# Patient Record
Sex: Male | Born: 1975 | Race: Black or African American | Hispanic: No | Marital: Married | Smoking: Never smoker
Health system: Southern US, Community
[De-identification: ages and names within clinical notes are randomized; demographics above are authoritative.]

## PROBLEM LIST (undated history)

## (undated) DIAGNOSIS — M7989 Other specified soft tissue disorders: Secondary | ICD-10-CM

## (undated) DIAGNOSIS — E559 Vitamin D deficiency, unspecified: Secondary | ICD-10-CM

## (undated) DIAGNOSIS — G4730 Sleep apnea, unspecified: Secondary | ICD-10-CM

## (undated) DIAGNOSIS — M2550 Pain in unspecified joint: Secondary | ICD-10-CM

## (undated) HISTORY — DX: Pain in unspecified joint: M25.50

## (undated) HISTORY — DX: Vitamin D deficiency, unspecified: E55.9

## (undated) HISTORY — DX: Other specified soft tissue disorders: M79.89

## (undated) HISTORY — PX: KNEE SURGERY: SHX244

## (undated) HISTORY — DX: Sleep apnea, unspecified: G47.30

## (undated) HISTORY — PX: KNEE ARTHROSCOPY: SUR90

## (undated) HISTORY — PX: WISDOM TOOTH EXTRACTION: SHX21

---

## 2013-07-19 ENCOUNTER — Emergency Department (HOSPITAL_COMMUNITY)
Admission: EM | Admit: 2013-07-19 | Discharge: 2013-07-19 | Disposition: A | Discharge status: Home / Self Care | Payer: Managed Care, Other (non HMO) | Attending: Emergency Medicine | Admitting: Emergency Medicine

## 2013-07-19 ENCOUNTER — Encounter (HOSPITAL_COMMUNITY): Payer: Self-pay | Admitting: Emergency Medicine

## 2013-07-19 ENCOUNTER — Emergency Department (HOSPITAL_COMMUNITY): Payer: Managed Care, Other (non HMO)

## 2013-07-19 DIAGNOSIS — X58XXXA Exposure to other specified factors, initial encounter: Secondary | ICD-10-CM | POA: Insufficient documentation

## 2013-07-19 DIAGNOSIS — Y939 Activity, unspecified: Secondary | ICD-10-CM | POA: Insufficient documentation

## 2013-07-19 DIAGNOSIS — S90426A Blister (nonthermal), unspecified lesser toe(s), initial encounter: Secondary | ICD-10-CM | POA: Insufficient documentation

## 2013-07-19 DIAGNOSIS — L089 Local infection of the skin and subcutaneous tissue, unspecified: Secondary | ICD-10-CM | POA: Insufficient documentation

## 2013-07-19 DIAGNOSIS — Y929 Unspecified place or not applicable: Secondary | ICD-10-CM | POA: Insufficient documentation

## 2013-07-19 MED ORDER — HYDROCODONE-ACETAMINOPHEN 5-325 MG PO TABS: 1.0000 | ORAL_TABLET | Freq: Four times a day (QID) | ORAL | Status: DC | PRN

## 2013-07-19 MED ORDER — MUPIROCIN CALCIUM 2 % EX CREA: TOPICAL_CREAM | Freq: Three times a day (TID) | CUTANEOUS | Status: DC

## 2013-07-19 MED ORDER — CEPHALEXIN 500 MG PO CAPS: 500.0000 mg | ORAL_CAPSULE | Freq: Four times a day (QID) | ORAL | Status: DC

## 2013-07-19 NOTE — ED Notes (Signed)
Pt reports one week hx of pain and swelling in 5th toe of  r/foot. Pt c/o "popping" sensation in foot followed by persistent pain and tenderness. Tx with Motrin

## 2013-07-19 NOTE — ED Provider Notes (Signed)
CSN: [id number]     Arrival date & time 07/19/13  1256 History  This chart was scribed for non-physician practitioner working with Celene Kras, MD by Ashley Jacobs, ED scribe. This patient was seen in room WTR5/WTR5 and the patient's care was started at 1:36 PM.   First MD Initiated Contact with Patient 07/19/13 1305     Chief Complaint  Patient presents with  . Foot Pain    r/foot pain x 1 week   (Consider location/radiation/quality/duration/timing/severity/associated sxs/prior Treatment) Patient is a 37 y.o. male presenting with lower extremity pain. The history is provided by the patient and medical records. No language interpreter was used.  Foot Pain This is a new problem. The current episode started more than 2 days ago. The problem occurs constantly. The problem has not changed since onset.The symptoms are aggravated by walking and standing. Nothing relieves the symptoms.   HPI Comments: Oday Ridings is a 37 y.o. male who arrives to the Emergency Department complaining of foot pain that presented 6 days ago after feeling a "popping" sensation on his right dorsal foot. Pt is experiencing swelling and purulent drainage from his inner right fifth toe. He was seen at the [facility] and received a X-Ray which reports negative findings. Pt denies being diabetic. He denies any allergies to antibiotics but is allergic to Tessalon. He has tried Motrin with no relief.    History reviewed. No pertinent past medical history. Past Surgical History  Procedure Laterality Date  . Knee surgery      l/knee   Family History  Problem Relation Age of Onset  . Hypertension Mother   . Diabetes Father   . Hypertension Father    History  Substance Use Topics  . Smoking status: Never Smoker   . Smokeless tobacco: Not on file  . Alcohol Use: Yes    Review of Systems  Skin: Positive for wound.       Swelling and pustulant drainage from inner right fifth toe  All other systems  reviewed and are negative.    Allergies  Tessalon  Home Medications   Current Outpatient Rx  Name  Route  Sig  Dispense  Refill  . ibuprofen (ADVIL,MOTRIN) 200 MG tablet   Oral   Take 200 mg by mouth every 6 (six) hours as needed for pain.         . phentermine 37.5 MG capsule   Oral   Take 37.5 mg by mouth every morning.         . Vitamin D, Ergocalciferol, (DRISDOL) 50000 UNITS CAPS capsule   Oral   Take 50,000 Units by mouth every 7 (seven) days.          BP 127/70  Pulse 87  Temp(Src) 98 F (36.7 C) (Oral)  Resp 18  Wt 362 lb (164.202 kg)  SpO2 100% Physical Exam  Nursing note and vitals reviewed. Constitutional: He is oriented to person, place, and time. He appears well-developed and well-nourished. No distress.  HENT:  Head: Normocephalic and atraumatic.  Mouth/Throat: Oropharynx is clear and moist.  Eyes: Conjunctivae are normal. Pupils are equal, round, and reactive to light. No scleral icterus.  Neck: Neck supple.  Cardiovascular: Normal rate, regular rhythm, normal heart sounds and intact distal pulses.   No murmur heard. Pulmonary/Chest: Effort normal and breath sounds normal. No stridor. No respiratory distress. He has no wheezes. He has no rales.  Abdominal: Soft. He exhibits no distension. There is no tenderness.  Musculoskeletal: Normal  range of motion. He exhibits no edema.  Swelling to right dorsal foot  Ulceration with purulent drainage from inner right fifth toe. Tender to palpitation over 5 th toe, 4th / 3rd metatarsal. DP intact.  Neurological: He is alert and oriented to person, place, and time.  Skin: Skin is warm and dry. No rash noted.  Psychiatric: He has a normal mood and affect. His behavior is normal.    ED Course  Procedures (including critical care time) DIAGNOSTIC STUDIES: Oxygen Saturation is 100% on room air, normal by my interpretation.    COORDINATION OF CARE: 1:38 PM Discussed course of care with pt which includes a  warm foot soak 5 times a day and antibiotics. Pt understands and agrees.  Labs Review Labs Reviewed - No data to display Imaging Review Dg Foot Complete Right  07/19/2013   CLINICAL DATA:  Right foot pain.  EXAM: RIGHT FOOT COMPLETE - 3+ VIEW  COMPARISON:  None.  FINDINGS: There is no evidence of fracture or dislocation. No significant arthropathy, evidence of bony lesion or bony destruction. Soft tissues are unremarkable.  IMPRESSION: Normal right foot radiographs.   Electronically Signed   By: Irish Lack M.D.   On: 07/19/2013 13:43    MDM   1. Blister of toe with infection, right, initial encounter      Pt with infected lesion in the right fifth toe. He is not diabetic or immunocompromised. Mild surrounding cellulities. Will start on keflex, topical bactroban, wound care and soaks. Follow up with pcp.   Filed Vitals:   07/19/13 1304  BP: 127/70  Pulse: 87  Temp: 98 F (36.7 C)  TempSrc: Oral  Resp: 18  Weight: 362 lb (164.202 kg)  SpO2: 100%     I personally performed the services described in this documentation, which was scribed in my presence. The recorded information has been reviewed and is accurate.    Lottie Mussel, PA-C 07/19/13 1831

## 2013-07-20 NOTE — ED Provider Notes (Signed)
Medical screening examination/treatment/procedure(s) were performed by non-physician practitioner and as supervising physician I was immediately available for consultation/collaboration.    Anniebell Bedore R Yuliya Nova, MD 07/20/13 0707 

## 2017-03-20 ENCOUNTER — Other Ambulatory Visit: Payer: Self-pay | Admitting: Sports Medicine

## 2017-03-20 DIAGNOSIS — M25561 Pain in right knee: Secondary | ICD-10-CM

## 2017-03-30 ENCOUNTER — Ambulatory Visit
Admission: RE | Admit: 2017-03-30 | Discharge: 2017-03-30 | Disposition: A | Discharge status: Home / Self Care | Payer: BLUE CROSS/BLUE SHIELD | Source: Ambulatory Visit | Attending: Sports Medicine | Admitting: Sports Medicine

## 2017-03-30 DIAGNOSIS — M25561 Pain in right knee: Secondary | ICD-10-CM

## 2018-06-03 ENCOUNTER — Encounter: Payer: Self-pay | Admitting: Family Medicine

## 2018-06-03 ENCOUNTER — Other Ambulatory Visit: Payer: Self-pay

## 2018-06-03 ENCOUNTER — Ambulatory Visit: Payer: BLUE CROSS/BLUE SHIELD | Admitting: Family Medicine

## 2018-06-03 VITALS — BP 128/68 | HR 100 | Temp 98.7°F | Resp 17 | Ht 69.0 in | Wt 300.8 lb

## 2018-06-03 DIAGNOSIS — Z113 Encounter for screening for infections with a predominantly sexual mode of transmission: Secondary | ICD-10-CM

## 2018-06-03 DIAGNOSIS — Z833 Family history of diabetes mellitus: Secondary | ICD-10-CM | POA: Diagnosis not present

## 2018-06-03 DIAGNOSIS — Z13220 Encounter for screening for lipoid disorders: Secondary | ICD-10-CM

## 2018-06-03 DIAGNOSIS — Z0000 Encounter for general adult medical examination without abnormal findings: Secondary | ICD-10-CM

## 2018-06-03 DIAGNOSIS — Z125 Encounter for screening for malignant neoplasm of prostate: Secondary | ICD-10-CM | POA: Diagnosis not present

## 2018-06-03 DIAGNOSIS — Z131 Encounter for screening for diabetes mellitus: Secondary | ICD-10-CM

## 2018-06-03 NOTE — Patient Instructions (Addendum)
   If you have lab work done today you will be contacted with your lab results within the next 2 weeks.  If you have not heard from us then please contact us. The fastest way to get your results is to register for My Chart.   IF you received an x-ray today, you will receive an invoice from [facility]. Please contact [facility] at [phone] with questions or concerns regarding your invoice.   IF you received labwork today, you will receive an invoice from [facility]. Please contact [facility] at [phone] with questions or concerns regarding your invoice.   Our billing staff will not be able to assist you with questions regarding bills from these companies.  You will be contacted with the lab results as soon as they are available. The fastest way to get your results is to activate your My Chart account. Instructions are located on the last page of this paperwork. If you have not heard from us regarding the results in 2 weeks, please contact this office.    Prediabetes Eating Plan Prediabetes-also called impaired glucose tolerance or impaired fasting glucose-is a condition that causes blood sugar (blood glucose) levels to be higher than normal. Following a healthy diet can help to keep prediabetes under control. It can also help to lower the risk of type 2 diabetes and heart disease, which are increased in people who have prediabetes. Along with regular exercise, a healthy diet:  Promotes weight loss.  Helps to control blood sugar levels.  Helps to improve the way that the body uses insulin.  What do I need to know about this eating plan?  Use the glycemic index (GI) to plan your meals. The index tells you how quickly a food will raise your blood sugar. Choose low-GI foods. These foods take a longer time to raise blood sugar.  Pay close attention to the amount of carbohydrates in the food that you eat. Carbohydrates increase blood sugar levels.  Keep track of how  many calories you take in. Eating the right amount of calories will help you to achieve a healthy weight. Losing about 7 percent of your starting weight can help to prevent type 2 diabetes.  You may want to follow a Mediterranean diet. This diet includes a lot of vegetables, lean meats or fish, whole grains, fruits, and healthy oils and fats. What foods can I eat? Grains Whole grains, such as whole-wheat or whole-grain breads, crackers, cereals, and pasta. Unsweetened oatmeal. Bulgur. Barley. Quinoa. Brown rice. Corn or whole-wheat flour tortillas or taco shells. Vegetables Lettuce. Spinach. Peas. Beets. Cauliflower. Cabbage. Broccoli. Carrots. Tomatoes. Squash. Eggplant. Herbs. Peppers. Onions. Cucumbers. Brussels sprouts. Fruits Berries. Bananas. Apples. Oranges. Grapes. Papaya. Mango. Pomegranate. Kiwi. Grapefruit. Cherries. Meats and Other Protein Sources Seafood. Lean meats, such as chicken and turkey or lean cuts of pork and beef. Tofu. Eggs. Nuts. Beans. Dairy Low-fat or fat-free dairy products, such as yogurt, cottage cheese, and cheese. Beverages Water. Tea. Coffee. Sugar-free or diet soda. Seltzer water. Milk. Milk alternatives, such as soy or almond milk. Condiments Mustard. Relish. Low-fat, low-sugar ketchup. Low-fat, low-sugar barbecue sauce. Low-fat or fat-free mayonnaise. Sweets and Desserts Sugar-free or low-fat pudding. Sugar-free or low-fat ice cream and other frozen treats. Fats and Oils Avocado. Walnuts. Olive oil. The items listed above may not be a complete list of recommended foods or beverages. Contact your dietitian for more options. What foods are not recommended? Grains Refined white flour and flour products, such as bread, pasta, snack foods,   and cereals. Beverages Sweetened drinks, such as sweet iced tea and soda. Sweets and Desserts Baked goods, such as cake, cupcakes, pastries, cookies, and cheesecake. The items listed above may not be a complete list of  foods and beverages to avoid. Contact your dietitian for more information. This information is not intended to replace advice given to you by your health care provider. Make sure you discuss any questions you have with your health care provider. Document Released: 02/16/2015 Document Revised: 03/09/2016 Document Reviewed: 10/28/2014 Elsevier Interactive Patient Education  2017 Elsevier Inc.  

## 2018-06-03 NOTE — Progress Notes (Signed)
Chief Complaint  Patient presents with  . New Patient (Initial Visit)    establish care.  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 No pertinent past medical history.  Current Outpatient Medications  Medication Sig Dispense Refill  . ibuprofen (ADVIL,MOTRIN) 200 MG tablet Take 200 mg by mouth every 6 (six) hours as needed for pain.    . Vitamin D, Ergocalciferol, (DRISDOL) 50000 UNITS CAPS capsule Take 50,000 Units by mouth every 7 (seven) days.     No current facility-administered medications for this visit.     Allergies:  Allergies  Allergen Reactions  . Tessalon [Benzonatate] Itching    Past Surgical History:  Procedure Laterality Date  . KNEE SURGERY     l/knee    Social History   Socioeconomic History  . Marital status: Married    Spouse name: Not on file  . Number of children: Not on file  . Years of education: Not on file  . Highest education level: Not on file  Occupational History  . Not on file  Social Needs  . Financial resource strain: Not very hard  . Food insecurity:    Worry: Never true    Inability: Never true  . Transportation needs:    Medical: No    Non-medical: No  Tobacco Use  . Smoking status: Never Smoker  . Smokeless tobacco: Never Used  Substance and Sexual Activity  . Alcohol use: Yes    Alcohol/week: 2.0 standard drinks    Types: 1 Cans of beer, 1 Shots of liquor per week    Comment: ocassionally  . Drug use: Never  . Sexual activity: Not on file  Lifestyle  . Physical activity:    Days per week: 5 days    Minutes per session: 50 min  . Stress: Rather much  Relationships  . Social connections:    Talks on phone: Not on file    Gets together: Not on  file    Attends religious service: Not on file    Active member of club or organization: Not on file    Attends meetings of clubs or organizations: Not on file    Relationship status: Married  Other Topics Concern  . Not on file  Social History Narrative  . Not on file    Family History  Problem Relation Age of Onset  . Hypertension Mother   . Hyperlipidemia Mother   . Diabetes Father   . Hypertension Father   . Cancer Father [id number]       liver cancer  . Mental illness Sister      Review of Systems  Constitutional: Negative for chills, fever and weight loss.  HENT: Negative for ear pain, hearing loss and tinnitus.   Eyes: Negative for blurred vision and double vision.  Respiratory: Negative for cough, shortness of breath and wheezing.   Cardiovascular: Negative for chest pain and palpitations.  Gastrointestinal: Negative for abdominal pain, heartburn, nausea and vomiting.  Genitourinary: Negative for dysuria, frequency and urgency.  Musculoskeletal: Negative for myalgias and neck pain.  Skin: Negative for itching and rash.  Neurological: Negative for dizziness, tingling and headaches.  Psychiatric/Behavioral: Negative for depression. The patient is not  nervous/anxious and does not have insomnia.    .ros   Objective: Vitals:   06/03/18 1627  BP: 128/68  Pulse: 100  Resp: 17  Temp: 98.7 F (37.1 C)  SpO2: 100%  Weight: (!) 300 lb 12.8 oz (136.4 kg)  Height: 5\' 9"  (1.753 m)  Body mass index is 44.42 kg/m.   Physical Exam  Constitutional: He is oriented to person, place, and time. He appears well-developed and well-nourished.  HENT:  Head: Normocephalic and atraumatic.  Right Ear: External ear normal.  Left Ear: External ear normal.  Nose: Nose normal.  Mouth/Throat: Oropharynx is clear and moist.  Eyes: Conjunctivae and EOM are normal.  Neck: Normal range of motion. Neck supple.  Cardiovascular: Normal rate, regular rhythm, normal heart sounds and intact distal  pulses.  No murmur heard. Pulmonary/Chest: Effort normal and breath sounds normal. No stridor. No respiratory distress. He has no wheezes. He has no rales. He exhibits no tenderness.  Abdominal: Soft. Bowel sounds are normal. He exhibits no distension and no mass. There is no tenderness. There is no guarding.  Musculoskeletal: Normal range of motion. He exhibits no edema.  Neurological: He is alert and oriented to person, place, and time. No cranial nerve deficit. Coordination normal.  Skin: Skin is warm. Capillary refill takes less than 2 seconds.  Psychiatric: He has a normal mood and affect. His behavior is normal. Judgment and thought content normal.    Assessment and Plan Denyse AmassCorey was seen today for new patient (initial visit).  Diagnoses and all orders for this visit:  Encounter for health maintenance examination in adult- reviewed age appropriate screenings Advised dental exam and eye exam  Family history of diabetes mellitus in father  Screening for prostate cancer -     PSA  Screen for STD (sexually transmitted disease) -     GC/Chlamydia Probe Amp -     Hepatitis B surface antigen -     HIV antibody -     RPR  Screening, lipid -     Lipid panel -     Comprehensive metabolic panel  Screening for diabetes mellitus -     Hemoglobin A1c  Morbid obesity (HCC)- pt working on a good plan for weight loss and has proving successful by losing weight consistently -     Hemoglobin A1c     Shavana Calder A Connee Ikner

## 2018-06-04 LAB — HEMOGLOBIN A1C
ESTIMATED AVERAGE GLUCOSE: 91 mg/dL
Hgb A1c MFr Bld: 4.8 % (ref 4.8–5.6)

## 2018-06-04 LAB — COMPREHENSIVE METABOLIC PANEL
ALT: 16 IU/L (ref 0–44)
AST: 18 IU/L (ref 0–40)
Albumin/Globulin Ratio: 1.4 (ref 1.2–2.2)
Albumin: 4.1 g/dL (ref 3.5–5.5)
Alkaline Phosphatase: 44 IU/L (ref 39–117)
BILIRUBIN TOTAL: 0.7 mg/dL (ref 0.0–1.2)
BUN/Creatinine Ratio: 9 (ref 9–20)
BUN: 10 mg/dL (ref 6–24)
CO2: 25 mmol/L (ref 20–29)
Calcium: 9.3 mg/dL (ref 8.7–10.2)
Chloride: 102 mmol/L (ref 96–106)
Creatinine, Ser: 1.09 mg/dL (ref 0.76–1.27)
GFR calc Af Amer: 96 mL/min/{1.73_m2} (ref >59)
GFR calc non Af Amer: 83 mL/min/{1.73_m2} (ref >59)
GLUCOSE: 92 mg/dL (ref 65–99)
Globulin, Total: 2.9 g/dL (ref 1.5–4.5)
Potassium: 4.3 mmol/L (ref 3.5–5.2)
Sodium: 140 mmol/L (ref 134–144)
Total Protein: 7 g/dL (ref 6.0–8.5)

## 2018-06-04 LAB — LIPID PANEL
Chol/HDL Ratio: 3.5 ratio (ref 0.0–5.0)
Cholesterol, Total: 183 mg/dL (ref 100–199)
HDL: 53 mg/dL (ref >39)
LDL CALC: 119 mg/dL — AB (ref 0–99)
Triglycerides: 57 mg/dL (ref 0–149)
VLDL Cholesterol Cal: 11 mg/dL (ref 5–40)

## 2018-06-04 LAB — HEPATITIS B SURFACE ANTIGEN: HEP B S AG: NEGATIVE

## 2018-06-04 LAB — RPR: RPR Ser Ql: NONREACTIVE

## 2018-06-04 LAB — HIV ANTIBODY (ROUTINE TESTING W REFLEX): HIV SCREEN 4TH GENERATION: NONREACTIVE

## 2018-06-04 LAB — GC/CHLAMYDIA PROBE AMP
Chlamydia trachomatis, NAA: NEGATIVE
Neisseria gonorrhoeae by PCR: NEGATIVE

## 2018-06-04 LAB — PSA: Prostate Specific Ag, Serum: 0.8 ng/mL (ref 0.0–4.0)

## 2018-07-02 IMAGING — MR MR KNEE*R* W/O CM
5 series · 38 of 40 positions shown · non-contrast
Comparison: None.

CLINICAL DATA: Patient twisted knee after falling off curb 2 weeks
ago and felt something pop. Severe pain beneath the knee cap with
swelling.

EXAM:
MRI OF THE RIGHT KNEE WITHOUT CONTRAST
TECHNIQUE: Multiplanar, multisequence MR imaging of the knee was performed. No
intravenous contrast was administered.

[Series 3: PD fat-sat · axial · right · 4.0mm · 0.47mm/px · z∈[-77,+55]mm · 9 of 31 slices shown (1 of 3)]
[im 1/31]
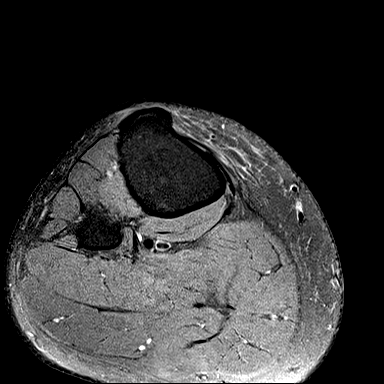
[im 4/31]
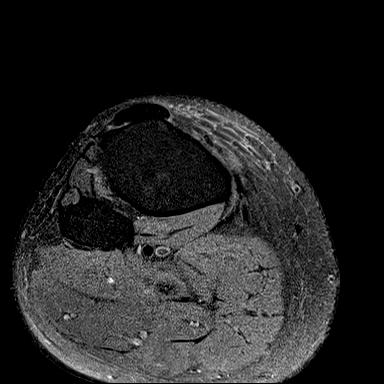
[im 8/31]
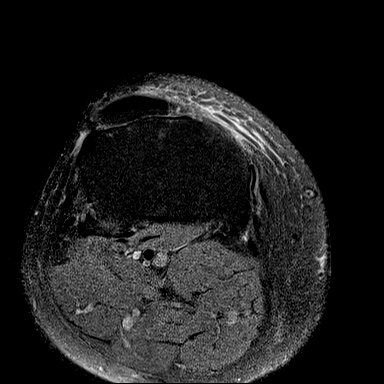
[im 12/31]
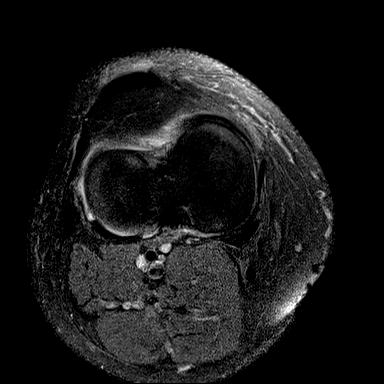
[im 16/31]
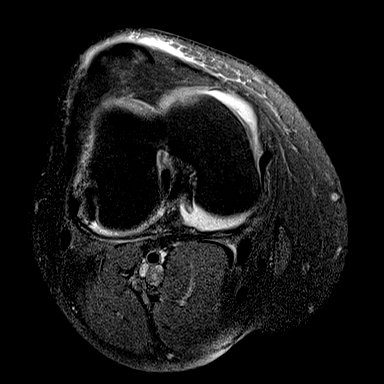
[im 19/31]
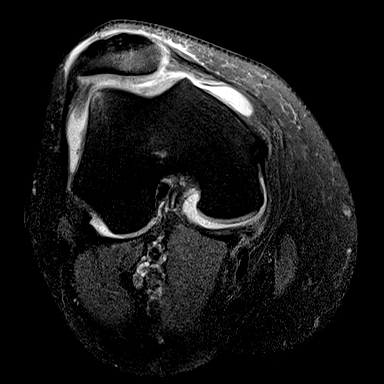
[im 23/31]
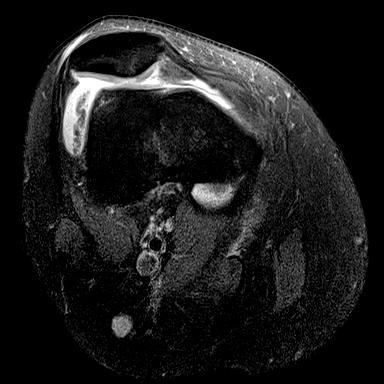
[im 27/31]
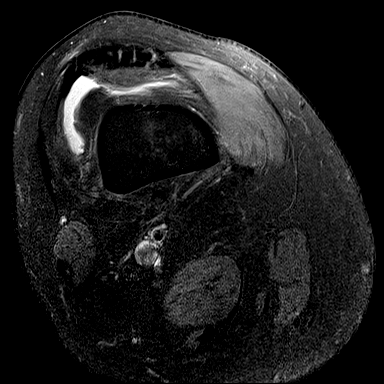
[im 31/31]
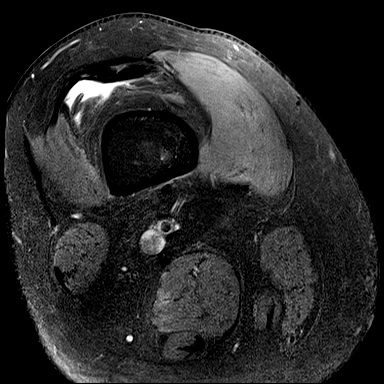

[Series 4: T1 · coronal · right · 3.0mm · 0.56mm/px · 6 of 33 slices shown]
[im 1/33]
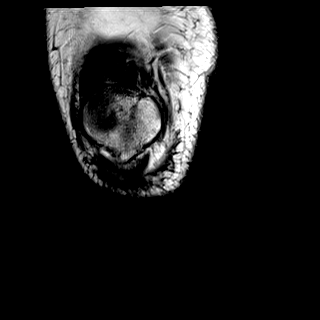
[im 5/33]
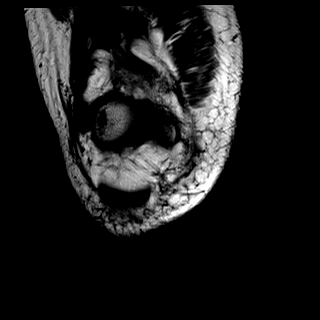
[im 10/33]
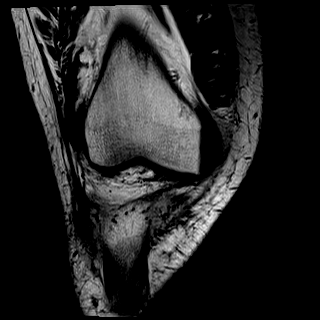
[im 14/33]
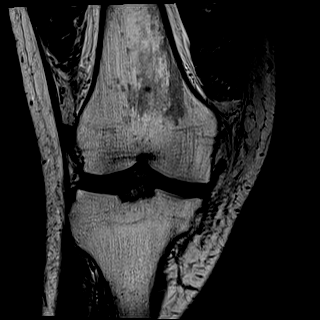
[im 19/33]
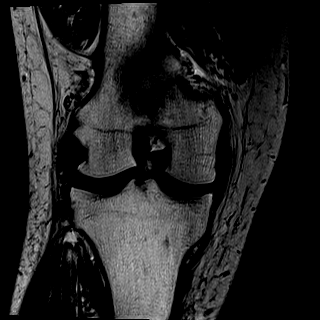
[im 23/33]
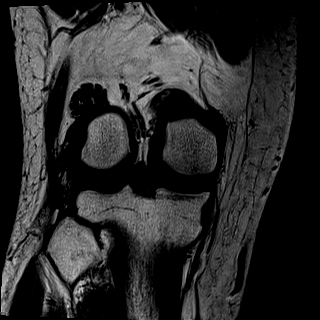

[Series 7: T2 fat-sat · coronal · right · 3.0mm · 0.47mm/px · 8 of 33 slices shown]
[im 1/33]
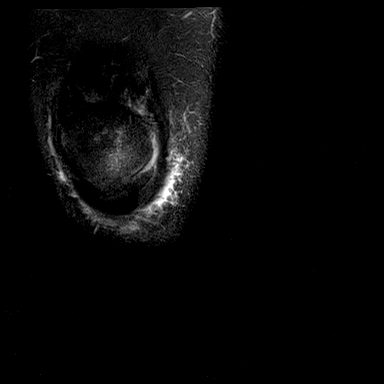
[im 5/33]
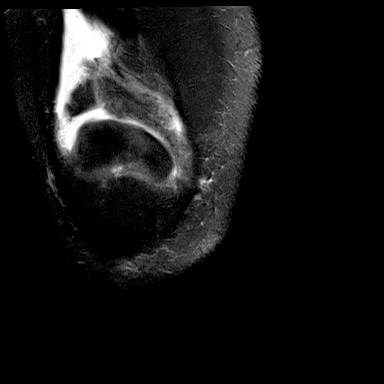
[im 10/33]
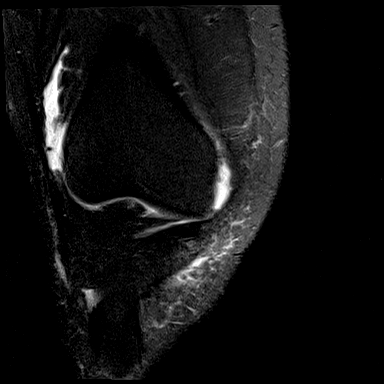
[im 14/33]
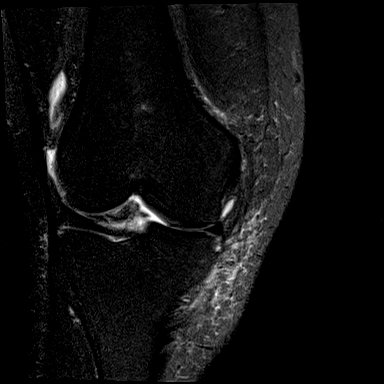
[im 19/33]
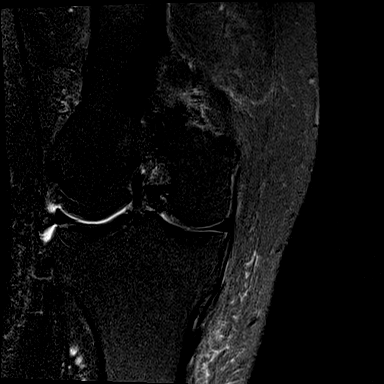
[im 23/33]
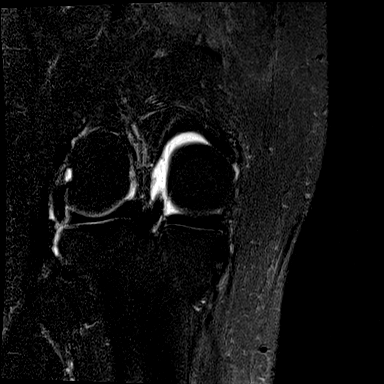
[im 28/33]
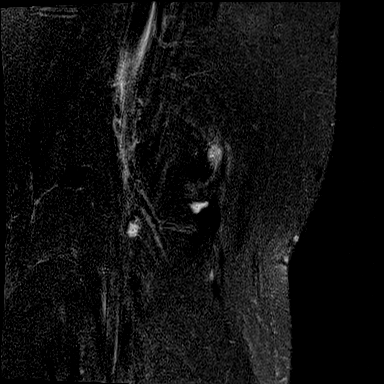
[im 33/33]
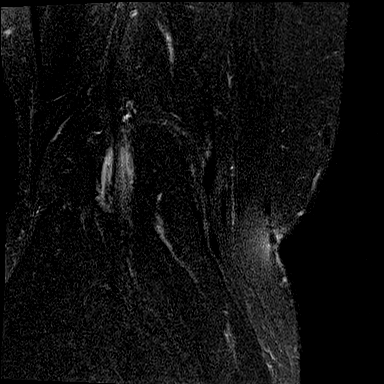

[Series 8: PD fat-sat · coronal · right · 3.0mm · 0.56mm/px · 8 of 33 slices shown (2 of 3)]
[im 1/33]
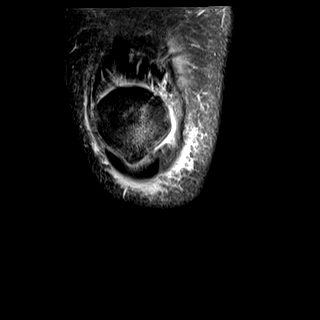
[im 5/33]
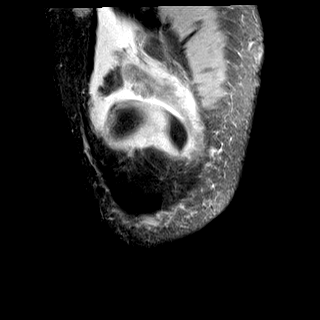
[im 10/33]
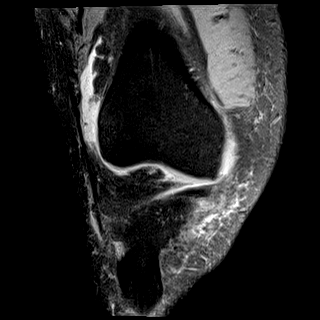
[im 14/33]
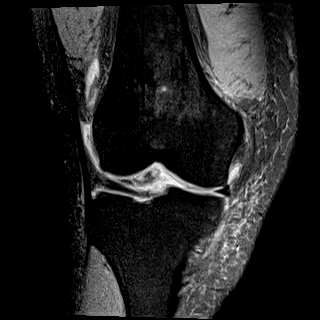
[im 19/33]
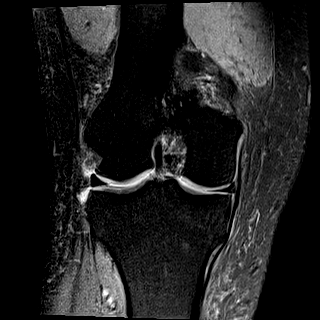
[im 23/33]
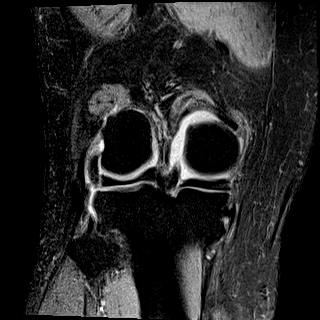
[im 28/33]
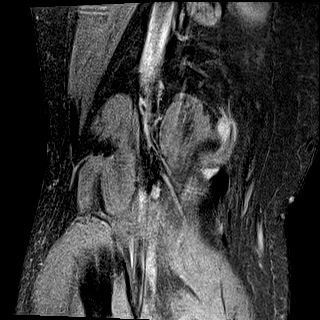
[im 33/33]
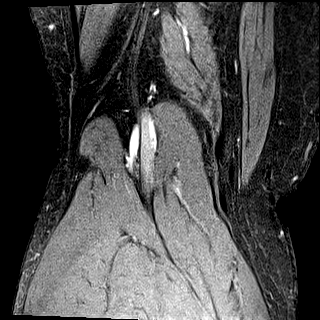

[Series 9: PD fat-sat · sagittal · right · 3.2mm · 0.62mm/px · 7 of 29 slices shown (3 of 3)]
[im 1/29]
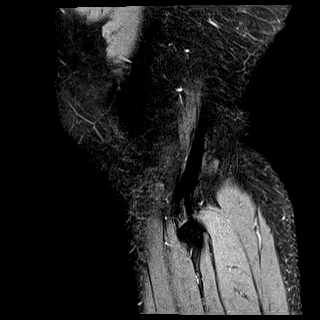
[im 5/29]
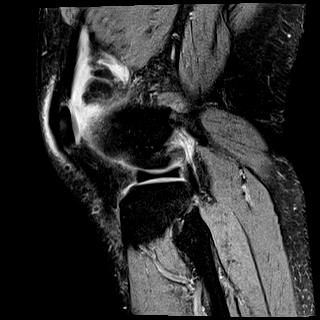
[im 10/29]
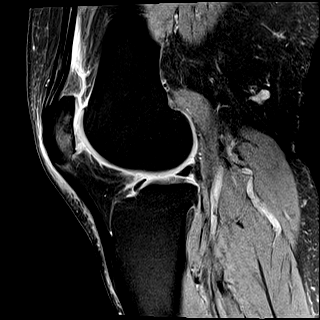
[im 15/29]
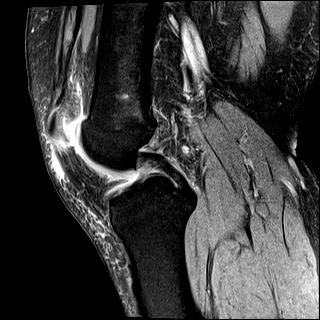
[im 19/29]
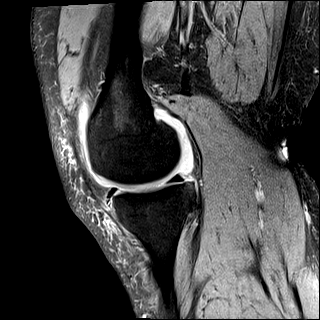
[im 24/29]
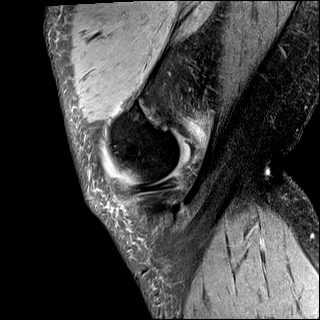
[im 29/29]
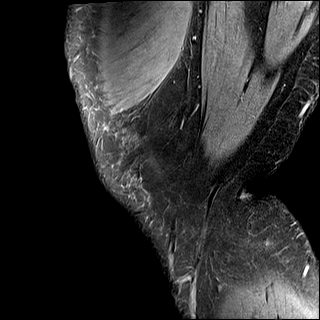

[38 of 40 positions shown; findings below may reference images not displayed]

FINDINGS: MENISCI

Medial meniscus:  Intact.

Lateral meniscus:  Intact

LIGAMENTS

Cruciates:  Intact ACL and PCL.

Collaterals:  Intact

CARTILAGE

Patellofemoral: Delaminating tear involving the patellar cartilage,
in some areas down to bone overlying the median ridge and medial
aspect of the lateral patellar cartilage. This area spans a
transverse length of 2.2 cm, series 3, image 11. This in conjunction
with reactive marrow edema of the patella and anterior lateral
femoral condyle would be keeping with stigmata of the lateral
patellar dislocation with subsequent reduction and resultant
osteochondral of the patella.

Medial:  Intact

Lateral:  Intact

Joint:    Small to moderate joint effusion

Popliteal Fossa:  Tiny popliteal cyst.  Intact popliteus.

Extensor Mechanism: Intact extensor mechanism tendon. Trace edema
adjacent to the inferior pole of the patella. The patellar
retinaculum and MPFL appear intact.

Bones: Transchondral fracture of the patella, series 9 image with
bone contusions of the patella and anterior lateral femoral condyle.

Other: Anterior subcutaneous edema overlying the patella and
patellar tendon.
IMPRESSION: 1. Bone marrow edema of the patella and lateral femoral condyle in a
pattern suspicious for lateral patellar dislocation with subsequent
reduction. There does appear to be a delaminating tear involving the
patellar cartilage overlying the median ridge and portions of the
lateral patellar facet cartilage in some areas down to bone. A
transchondral fracture of the patella suspected given hypointense
linear signal seen on the sagittal images deep to the chondral
defect.
2. Intact menisci.
3. Intact collateral and cruciate ligaments.

## 2019-10-17 HISTORY — PX: VASECTOMY: SHX75

## 2021-01-13 DIAGNOSIS — H6120 Impacted cerumen, unspecified ear: Secondary | ICD-10-CM | POA: Diagnosis not present

## 2021-01-13 DIAGNOSIS — M7551 Bursitis of right shoulder: Secondary | ICD-10-CM | POA: Diagnosis not present

## 2021-02-28 ENCOUNTER — Ambulatory Visit
Admission: EM | Admit: 2021-02-28 | Discharge: 2021-02-28 | Discharge status: Against Medical Advice | Payer: BLUE CROSS/BLUE SHIELD

## 2021-04-22 ENCOUNTER — Other Ambulatory Visit: Payer: Self-pay

## 2021-04-22 ENCOUNTER — Encounter: Payer: Self-pay | Admitting: Emergency Medicine

## 2021-04-22 ENCOUNTER — Ambulatory Visit
Admission: EM | Admit: 2021-04-22 | Discharge: 2021-04-22 | Disposition: A | Discharge status: Home / Self Care | Payer: BC Managed Care – PPO | Attending: Medical Oncology | Admitting: Medical Oncology

## 2021-04-22 DIAGNOSIS — M25511 Pain in right shoulder: Secondary | ICD-10-CM | POA: Diagnosis not present

## 2021-04-22 MED ORDER — METHOCARBAMOL 500 MG PO TABS: 500.0000 mg | ORAL_TABLET | Freq: Two times a day (BID) | ORAL | 0 refills | Status: DC

## 2021-04-22 MED ORDER — MELOXICAM 7.5 MG PO TABS: 7.5000 mg | ORAL_TABLET | Freq: Every day | ORAL | 0 refills | Status: DC

## 2021-04-22 NOTE — ED Provider Notes (Addendum)
[facility]    CSN: [id number] Arrival date & time: 04/22/21  1634      History   Chief Complaint Chief Complaint  Patient presents with   Shoulder Pain    right    HPI Bill Smith is a 45 y.o. male.   HPI  Shoulder Pain: Patient reports that he has had right shoulder pain for about 2 months.  Pain started after a mild injury.  Pain is about the same to potentially slightly worsened since this time.  He states that about 2 weeks ago he was seen by a other urgent care office for his discomfort.  He reports that he had normal x-ray imaging that was told that he had some inflammation in his shoulder.  He was given medication that he does not recall but he did not take this but for a few doses without improvement so he stopped this medication.  He wonders what the next steps are that he needs to do today.  He denies any significant numbness or tingling, loss of sensation or swelling of the arm.  History reviewed. No pertinent past medical history.  There are no problems to display for this patient.   Past Surgical History:  Procedure Laterality Date   KNEE SURGERY     l/knee       Home Medications    Prior to Admission medications   Medication Sig Start Date End Date Taking? Authorizing Provider  ibuprofen (ADVIL,MOTRIN) 200 MG tablet Take 200 mg by mouth every 6 (six) hours as needed for pain.    [provider]  Vitamin D, Ergocalciferol, (DRISDOL) 50000 UNITS CAPS capsule Take 50,000 Units by mouth every 7 (seven) days.    [provider]    Family History Family History  Problem Relation Age of Onset   Hypertension Mother    Hyperlipidemia Mother    Diabetes Father    Hypertension Father    Cancer Father 37       liver cancer   Mental illness Sister     Social History Social History   Tobacco Use   Smoking status: Never   Smokeless tobacco: Never  Vaping Use   Vaping Use: Never used  Substance Use Topics   Alcohol use:  Yes    Alcohol/week: 2.0 standard drinks    Types: 1 Cans of beer, 1 Shots of liquor per week    Comment: ocassionally   Drug use: Never     Allergies   Tessalon [benzonatate]   Review of Systems Review of Systems  As stated above in HPI Physical Exam Triage Vital Signs ED Triage Vitals  Enc Vitals Group     BP 04/22/21 1716 (!) 119/95     Pulse Rate 04/22/21 1716 82     Resp 04/22/21 1716 16     Temp 04/22/21 1716 98.4 F (36.9 C)     Temp Source 04/22/21 1716 Oral     SpO2 04/22/21 1716 97 %     Weight 04/22/21 1713 (!) 330 lb (149.7 kg)     Height 04/22/21 1713 5\' 9"  (1.753 m)     Head Circumference --      Peak Flow --      Pain Score 04/22/21 1713 7     Pain Loc --      Pain Edu? --      Excl. in GC? --    No data found.  Updated Vital Signs BP (!) 119/95 (BP Location: Left Arm)  Pulse 82   Temp 98.4 F (36.9 C) (Oral)   Resp 16   Ht 5\' 9"  (1.753 m)   Wt (!) 330 lb (149.7 kg)   SpO2 97%   BMI 48.73 kg/m    Physical Exam Vitals and nursing note reviewed.  Constitutional:      Appearance: Normal appearance.  Musculoskeletal:        General: Tenderness (right anterior shoulder) present. No swelling. Normal range of motion.     Right lower leg: No edema.     Left lower leg: No edema.     Comments: No mall empty can testing, normal Apley scratch testing, normal arc of motion  Skin:    General: Skin is warm.     Coloration: Skin is not jaundiced or pale.     Findings: No bruising, erythema or rash.  Neurological:     Mental Status: He is alert.     Comments: Strength 5 out of 5 bilaterally with intact sensation of bilateral arms     UC Treatments / Results  Labs (all labs ordered are listed, but only abnormal results are displayed) Labs Reviewed - No data to display  EKG   Radiology No results found.  Procedures Procedures (including critical care time)  Medications Ordered in UC Medications - No data to display  Initial  Impression / Assessment and Plan / UC Course  I have reviewed the triage vital signs and the nursing notes.  Pertinent labs & imaging results that were available during my care of the patient were reviewed by me and considered in my medical decision making (see chart for details).     New.  We are going to trial him on muscle relaxers and NSAIDs.  I discussed with patient the benefits of trialing these medications ahead of seeing orthopedics.  We discussed how to use these medications along with common potential side effects and precautions.  Final Clinical Impressions(s) / UC Diagnoses   Final diagnoses:  None   Discharge Instructions   None    ED Prescriptions   None    PDMP not reviewed this encounter.   , PA-C 04/22/21 1729    06/23/21, PA-C 04/22/21 1731

## 2021-04-22 NOTE — ED Triage Notes (Signed)
Patient states that he felt a pop in his right shoulder about a month ago.  Patient states that he had an xray done couple of weeks ago.  Patient reports ongoing pain in his right shoulder.

## 2021-05-26 DIAGNOSIS — Z20822 Contact with and (suspected) exposure to covid-19: Secondary | ICD-10-CM | POA: Diagnosis not present

## 2021-10-03 ENCOUNTER — Other Ambulatory Visit: Payer: Self-pay

## 2021-10-03 ENCOUNTER — Encounter: Payer: Self-pay | Admitting: Family Medicine

## 2021-10-03 ENCOUNTER — Ambulatory Visit: Payer: BC Managed Care – PPO | Admitting: Family Medicine

## 2021-10-03 VITALS — BP 122/78 | HR 95 | Ht 69.0 in | Wt 358.0 lb

## 2021-10-03 DIAGNOSIS — M7541 Impingement syndrome of right shoulder: Secondary | ICD-10-CM

## 2021-10-03 DIAGNOSIS — M7521 Bicipital tendinitis, right shoulder: Secondary | ICD-10-CM

## 2021-10-03 HISTORY — DX: Impingement syndrome of right shoulder: M75.41

## 2021-10-03 MED ORDER — DICLOFENAC SODIUM 75 MG PO TBEC: 75.0000 mg | DELAYED_RELEASE_TABLET | Freq: Two times a day (BID) | ORAL | 0 refills | Status: DC

## 2021-10-03 NOTE — Assessment & Plan Note (Signed)
See additional assessment(s) for plan details. 

## 2021-10-03 NOTE — Assessment & Plan Note (Signed)
Right-hand-dominant patient with several months (02/2021 timeframe) right anterolateral shoulder pain, atraumatic in nature, did note close proximity to pain starting after a move.  Pain without clear radiation, aggravated by overhead and reaching behind activities, strenuous activities, has noted intermittent night pain, mixed response from various Rx and OTC NSAIDs and treatments to date.  He does state that he had outside x-rays imaging which revealed no acute issues.  He denies any overt neck or elbow pain.  Physical examination reveals full forward flexion with pain to the anterior shoulder during maximal forward flexion, abduction limited by 5 degrees when compared to contralateral, this is secondary to anterolateral pain, external rotation and extension/internal rotation are benign.  Rotator cuff strength testing reveals full painless external and internal rotation against resistance, isolated supraspinatus testing with 5/5 strength, recreates pain, nontender throughout, positive Neer's, positive Hawkins, positive speeds, positive Yergason's; the latter 2 provocative testing are maximally painful.  Patient is constellation of findings and history are most consistent with biceps tendinitis with secondary compensatory rotator cuff related subacromial impingement.  Given the timeline and clinical course I reviewed various treatment strategies with the patient and he is amenable to scheduled diclofenac 75 mg for 3 weeks, regular icing regimen, activity modification, and close follow-up in 3 weeks time.  Pending symptoms at return, if suboptimal, can consider ultrasound-guided biceps tendon sheath and/or subacromial injections.  Once symptoms adequately improved, plan for home-based versus formal physical therapy.

## 2021-10-03 NOTE — Progress Notes (Signed)
°  ° °  Primary Care / Sports Medicine Office Visit  Patient Information:  Patient ID: Bill Smith, male DOB: 13-Nov-1975 Age: 45 y.o. MRN: [id number]   Bill Smith is a pleasant 45 y.o. male presenting with the following:  Chief Complaint  Patient presents with   New Patient (Initial Visit)   Establish Care   Shoulder Pain    Right; started 04/2021; no known injury or trauma, was sleeping on his right side and felt a pop and ever since has had pain; limited ROM and describes as torn, radiates to fingers, throbbing, sharp, constant; has used ice, heat, pain cream, Tylenol arthritis, ibuprofen, meloxicam, methocarbamol with no relief; right-handedness; 8/10 pain     Patient Active Problem List   Diagnosis Date Noted   Biceps tendinitis, right 10/03/2021   Rotator cuff impingement syndrome, right 10/03/2021    Vitals:   10/03/21 0836  BP: 122/78  Pulse: 95  SpO2: 98%   Vitals:   10/03/21 0836  Weight: (!) 358 lb (162.4 kg)  Height: 5\' 9"  (1.753 m)   Body mass index is 52.87 kg/m.  No results found.   Independent interpretation of notes and tests performed by another provider:   None  Procedures performed:   None  Pertinent History, Exam, Impression, and Recommendations:   Biceps tendinitis, right Right-hand-dominant patient with several months (02/2021 timeframe) right anterolateral shoulder pain, atraumatic in nature, did note close proximity to pain starting after a move.  Pain without clear radiation, aggravated by overhead and reaching behind activities, strenuous activities, has noted intermittent night pain, mixed response from various Rx and OTC NSAIDs and treatments to date.  He does state that he had outside x-rays imaging which revealed no acute issues.  He denies any overt neck or elbow pain.  Physical examination reveals full forward flexion with pain to the anterior shoulder during maximal forward flexion, abduction limited by 5 degrees when compared to  contralateral, this is secondary to anterolateral pain, external rotation and extension/internal rotation are benign.  Rotator cuff strength testing reveals full painless external and internal rotation against resistance, isolated supraspinatus testing with 5/5 strength, recreates pain, nontender throughout, positive Neer's, positive Hawkins, positive speeds, positive Yergason's; the latter 2 provocative testing are maximally painful.  Patient is constellation of findings and history are most consistent with biceps tendinitis with secondary compensatory rotator cuff related subacromial impingement.  Given the timeline and clinical course I reviewed various treatment strategies with the patient and he is amenable to scheduled diclofenac 75 mg for 3 weeks, regular icing regimen, activity modification, and close follow-up in 3 weeks time.  Pending symptoms at return, if suboptimal, can consider ultrasound-guided biceps tendon sheath and/or subacromial injections.  Once symptoms adequately improved, plan for home-based versus formal physical therapy.  Rotator cuff impingement syndrome, right See additional assessment(s) for plan details.   Orders & Medications Meds ordered this encounter  Medications   diclofenac (VOLTAREN) 75 MG EC tablet    Sig: Take 1 tablet (75 mg total) by mouth 2 (two) times daily.    Dispense:  60 tablet    Refill:  0   No orders of the defined types were placed in this encounter.    Return in about 3 weeks (around 10/24/2021).     12/22/2021, MD   Primary Care Sports Medicine [facility]

## 2021-10-03 NOTE — Patient Instructions (Signed)
-   Dose diclofenac twice daily with food (no other NSAIDs while on this medication) - Perform activity as tolerated using shoulder symptoms as a guide - Recommend at least once daily 20 minutes icing of the shoulder, can repeat as often as needed - Return for follow-up in 3 weeks, contact for any questions between now and then

## 2021-10-24 ENCOUNTER — Other Ambulatory Visit: Payer: Self-pay

## 2021-10-24 ENCOUNTER — Encounter: Payer: Self-pay | Admitting: Family Medicine

## 2021-10-24 ENCOUNTER — Inpatient Hospital Stay (INDEPENDENT_AMBULATORY_CARE_PROVIDER_SITE_OTHER): Payer: BC Managed Care – PPO | Admitting: Radiology

## 2021-10-24 ENCOUNTER — Ambulatory Visit: Payer: BC Managed Care – PPO | Admitting: Family Medicine

## 2021-10-24 VITALS — BP 98/64 | HR 84 | Ht 69.0 in | Wt 360.0 lb

## 2021-10-24 DIAGNOSIS — M7541 Impingement syndrome of right shoulder: Secondary | ICD-10-CM

## 2021-10-24 DIAGNOSIS — M7521 Bicipital tendinitis, right shoulder: Secondary | ICD-10-CM | POA: Diagnosis not present

## 2021-10-24 MED ORDER — TRIAMCINOLONE ACETONIDE 40 MG/ML IJ SUSP
80.0000 mg | Freq: Once | INTRAMUSCULAR | Status: AC
  Administered 2021-10-24: 80 mg

## 2021-10-24 MED ORDER — DICLOFENAC SODIUM 75 MG PO TBEC: 75.0000 mg | DELAYED_RELEASE_TABLET | Freq: Two times a day (BID) | ORAL | 0 refills | Status: DC | PRN

## 2021-10-24 NOTE — Patient Instructions (Signed)
You have just been given a cortisone injection to reduce pain and inflammation. After the injection you may notice immediate relief of pain as a result of the Lidocaine. It is important to rest the area of the injection for 24 to 48 hours after the injection. There is a possibility of some temporary increased discomfort and swelling for up to 72 hours until the cortisone begins to work. If you do have pain, simply rest the joint and use ice. If you can tolerate over the counter medications, you can try Tylenol, Aleve, or Advil for added relief per package instructions. - Continue diclofenac, take on an as-needed basis up to twice a day with food for shoulder pain until symptoms respond to cortisone - Start physical therapy, referral coordinator will contact you for scheduling - Return for follow-up in 6 weeks, contact your office for any questions between now and then

## 2021-10-24 NOTE — Assessment & Plan Note (Signed)
Patient with history of chronic right shoulder pain with ongoing exacerbation. See additional assessment(s) for plan details.  Given his persistent histopathology did elect to proceed with corticosteroid injections of the subacromial space and biceps tendon sheath.  Medication management with diclofenac performed, formal physical therapy, and follow-up in 6 weeks.

## 2021-10-24 NOTE — Assessment & Plan Note (Addendum)
Patient presents for follow-up to chronic right shoulder pain (onset 02/2021 timeframe) with exacerbation.  This is in the setting of right shoulder biceps tendinitis and secondary/compensatory subacromial impingement.  At the last visit he was advised scheduled diclofenac twice daily, ice), and activity modification.  Since that time he states that he has noted mild improvement while on the diclofenac however but he misses a dose pain returns to initial level of severity.  This continues to interfere with daily tasks.  Physical exam shows positive speeds and Yergason's, comparable to interval visit.  He does have preserved rotator cuff strength though with pain, positive Neer's and Hawkins though less symptomatic compared to prior exam.  Given his clinical course I have reviewed additional steps and he did elect to proceed with ultrasound-guided corticosteroid injections to the biceps tendon sheath and subacromial space.  Postinjection care reviewed.  Additionally, given the lengthy duration of symptomatology, I have recommended formal physical therapy, he is amenable to this.  He is to return for follow-up in 6 weeks for reevaluation.  Pending symptoms at return, if suboptimal despite adherence to shared plan, anticipate advanced imaging.  Also discussed medication management in regards to transitioning from scheduled diclofenac to as needed until symptoms respond to cortisone injections.

## 2021-10-24 NOTE — Progress Notes (Signed)
Primary Care / Sports Medicine Office Visit  Patient Information:  Patient ID: Bill Smith, male DOB: Nov 18, 1975 Age: 46 y.o. MRN: [id number]   Bill Smith is a pleasant 46 y.o. male presenting with the following:  Chief Complaint  Patient presents with   Biceps tendinitis, right    Better; taking diclofenac twice daily and alternating heat and ice with some relief; 4/10 pain    Patient Active Problem List   Diagnosis Date Noted   Biceps tendinitis, right 10/03/2021   Rotator cuff impingement syndrome, right 10/03/2021    Vitals:   10/24/21 0753  BP: 98/64  Pulse: 84  SpO2: 98%   Vitals:   10/24/21 0753  Weight: (!) 360 lb (163.3 kg)  Height: 5\' 9"  (1.753 m)   Body mass index is 53.16 kg/m.  LIMITED JOINT SPACE STRUCTURES UP RIGHT  Result Date: 10/24/2021 Procedure:  Injection of right shoulder biceps tendon sheath under ultrasound guidance. Ultrasound guidance utilized for visualization of out of plane biceps tendon sheath, hyperechogenicity noted surrounding the tendon most likely consistent with tendon thickening/inflammation, hypoechoic peritendinous response noted from injectate Samsung HS60 device utilized with permanent recording / reporting. Consent obtained and verified. Skin prepped in a sterile fashion. Ethyl chloride spray for topical local analgesia. Completed without difficulty and tolerated well. Medication: triamcinolone acetonide 40 mg/mL suspension for injection 1 mL total and 2 mL lidocaine 1% without epinephrine utilized for needle placement anesthetic Advised to contact for fevers/chills, erythema, induration, drainage, or persistent bleeding. Procedure:  Injection of right shoulder subacromial space under ultrasound guidance. Ultrasound guidance utilized for in plane approach to right shoulder subacromial space, deltoid and supraspinatus muscles visualized, no significant effusion noted Samsung HS60 device utilized with permanent recording /  reporting. Consent obtained and verified. Skin prepped in a sterile fashion. Ethyl chloride spray for topical local analgesia. Completed without difficulty and tolerated well. Medication: triamcinolone acetonide 40 mg/mL suspension for injection 1 mL total and 2 mL lidocaine 1% without epinephrine utilized for needle placement anesthetic Advised to contact for fevers/chills, erythema, induration, drainage, or persistent bleeding.    Independent interpretation of notes and tests performed by another provider:   None  Procedures performed:   Procedure:  Injection of right shoulder biceps tendon sheath under ultrasound guidance. Ultrasound guidance utilized for visualization of out of plane biceps tendon sheath, hyperechogenicity noted surrounding the tendon most likely consistent with tendon thickening/inflammation, hypoechoic peritendinous response noted from injectate Samsung HS60 device utilized with permanent recording / reporting. Consent obtained and verified. Skin prepped in a sterile fashion. Ethyl chloride spray for topical local analgesia.  Completed without difficulty and tolerated well. Medication: triamcinolone acetonide 40 mg/mL suspension for injection 1 mL total and 2 mL lidocaine 1% without epinephrine utilized for needle placement anesthetic Advised to contact for fevers/chills, erythema, induration, drainage, or persistent bleeding.  Procedure:  Injection of right shoulder subacromial space under ultrasound guidance. Ultrasound guidance utilized for in plane approach to right shoulder subacromial space, deltoid and supraspinatus muscles visualized, no significant effusion noted Samsung HS60 device utilized with permanent recording / reporting. Consent obtained and verified. Skin prepped in a sterile fashion. Ethyl chloride spray for topical local analgesia.  Completed without difficulty and tolerated well. Medication: triamcinolone acetonide 40 mg/mL suspension for injection  1 mL total and 2 mL lidocaine 1% without epinephrine utilized for needle placement anesthetic Advised to contact for fevers/chills, erythema, induration, drainage, or persistent bleeding.   Pertinent History, Exam, Impression, and Recommendations:   Biceps  tendinitis, right Patient presents for follow-up to chronic right shoulder pain (onset 02/2021 timeframe) with exacerbation.  This is in the setting of right shoulder biceps tendinitis and secondary/compensatory subacromial impingement.  At the last visit he was advised scheduled diclofenac twice daily, ice), and activity modification.  Since that time he states that he has noted mild improvement while on the diclofenac however but he misses a dose pain returns to initial level of severity.  This continues to interfere with daily tasks.  Physical exam shows positive speeds and Yergason's, comparable to interval visit.  He does have preserved rotator cuff strength though with pain, positive Neer's and Hawkins though less symptomatic compared to prior exam.  Given his clinical course I have reviewed additional steps and he did elect to proceed with ultrasound-guided corticosteroid injections to the biceps tendon sheath and subacromial space.  Postinjection care reviewed.  Additionally, given the lengthy duration of symptomatology, I have recommended formal physical therapy, he is amenable to this.  He is to return for follow-up in 6 weeks for reevaluation.  Pending symptoms at return, if suboptimal despite adherence to shared plan, anticipate advanced imaging.  Also discussed medication management in regards to transitioning from scheduled diclofenac to as needed until symptoms respond to cortisone injections.  Rotator cuff impingement syndrome, right Patient with history of chronic right shoulder pain with ongoing exacerbation. See additional assessment(s) for plan details.  Given his persistent histopathology did elect to proceed with corticosteroid  injections of the subacromial space and biceps tendon sheath.  Medication management with diclofenac performed, formal physical therapy, and follow-up in 6 weeks.   Orders & Medications Meds ordered this encounter  Medications   diclofenac (VOLTAREN) 75 MG EC tablet    Sig: Take 1 tablet (75 mg total) by mouth 2 (two) times daily as needed (shoulder pain).    Dispense:  60 tablet    Refill:  0   triamcinolone acetonide (KENALOG-40) injection 80 mg   Orders Placed This Encounter  Procedures   [place] LIMITED JOINT SPACE STRUCTURES UP RIGHT   Ambulatory referral to Physical Therapy     Return in about 6 weeks (around 12/05/2021) for follow-up right shoulder.     Jerrol Banana, MD   Primary Care Sports Medicine [facility]

## 2021-10-26 ENCOUNTER — Encounter: Payer: Self-pay | Admitting: Physical Therapy

## 2021-10-26 ENCOUNTER — Ambulatory Visit: Payer: BC Managed Care – PPO | Attending: Family Medicine | Admitting: Physical Therapy

## 2021-10-26 ENCOUNTER — Other Ambulatory Visit: Payer: Self-pay

## 2021-10-26 DIAGNOSIS — M25611 Stiffness of right shoulder, not elsewhere classified: Secondary | ICD-10-CM | POA: Diagnosis not present

## 2021-10-26 DIAGNOSIS — M7521 Bicipital tendinitis, right shoulder: Secondary | ICD-10-CM | POA: Diagnosis not present

## 2021-10-26 DIAGNOSIS — G8929 Other chronic pain: Secondary | ICD-10-CM | POA: Insufficient documentation

## 2021-10-26 DIAGNOSIS — M7541 Impingement syndrome of right shoulder: Secondary | ICD-10-CM | POA: Diagnosis not present

## 2021-10-26 DIAGNOSIS — M25511 Pain in right shoulder: Secondary | ICD-10-CM | POA: Insufficient documentation

## 2021-10-26 NOTE — Therapy (Signed)
[facility] [address] [place], [place], [address] Phone: [phone]   Fax:  [phone]  Physical Therapy Evaluation  Patient Details  Name: Bill DominoCorey Smith MRN: [id number] Date of Birth: Aug 02, 1976 Referring Provider (PT): Joseph BerkshireJason Matthews, MD   Encounter Date: 10/26/2021   PT End of Session - 10/26/21 1122     Visit Number 1    Number of Visits 13    Date for PT Re-Evaluation 12/07/21    Authorization Type BCBS max combined 30 visits PT/OT per year    Authorization Time Period Cert 6/38/75-6/43/321/11/23-3/16/23    Progress Note Due on Visit 10    PT Start Time 0848    PT Stop Time 0935    PT Time Calculation (min) 47 min    Activity Tolerance Patient tolerated treatment well;No increased pain    Behavior During Therapy WFL for tasks assessed/performed             Past Medical History:  Diagnosis Date   Vitamin D deficiency     History reviewed. No pertinent surgical history.  There were no vitals filed for this visit.    Subjective Assessment - 10/26/21 1310     Subjective Patient is a 46 year old male with primary complaint of R shoulder pain.    Pertinent History Patient is a 46 year old male with primary complaint of R shoulder pain. Patient has referring diagnoses of R biceps tendonitis and R rotator cuff impingement syndrome. S/p cortisone injection 10/24/21 that he feels has helped significantly. Patient reports having pop in his R shoulder when he was sleeping on his side. He reports difficulty with lifting his R arm above his head. Patient reports that medication and injection have both helped. Patient reports intermittent numbness affecting his R upper limb from shoulder to fingertips, affecting all 5 fingers per report. Patient reports not experiencing as much numbness/paresthesias over the last week. Patient is R hand dominant and has to seldom lift during the week or help someone lift. 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 Patient reports not having night pain presently. Patient reports no unexplained weight loss. Pt reports tolerating lying on R side well recently - he hasn't done this much due to being cautious per report. Pt does have intermittent clicking when moving his arm. Patient goals: no pain in his shoulder, able to throw football with his son.    Patient Stated Goals no pain in his shoulder, able to throw football with his son.    Currently in Pain? Yes    Aggravating Factors  cross-body adduction, overhead movement/overhead work    Pain Relieving Factors injection, Rx medication                [facility] PT Assessment - 10/26/21 1126       Assessment   Medical Diagnosis Biceps tendonitis, right; rotator cuff impingement syndrome, right    Referring Provider (PT) Joseph BerkshireJason Matthews, MD    Onset Date/Surgical Date 04/15/21    Hand Dominance Right    Next MD Visit 6 weeks    Prior Therapy None for this condition      Balance Screen   Has the patient fallen in the past 6 months No      Prior Function   Level of Independence Independent      Cognition   Overall Cognitive Status Within Functional Limits for tasks  assessed               SUBJECTIVE Chief complaint: Patient is a 46 year old male with primary complaint of R shoulder pain.  Onset: Patient is a 46 year old male with primary complaint of R shoulder pain. Patient has referring diagnoses of R biceps tendonitis and R rotator cuff impingement syndrome. S/p cortisone injection 10/24/21 that he feels has helped significantly. Patient reports having pop in his R shoulder when he was sleeping on his side. He reports difficulty with lifting his R arm above his head. Patient reports that medication and injection have both helped. Patient reports intermittent numbness affecting his R upper limb from shoulder to  fingertips, affecting all 5 fingers per report. Patient reports not experiencing as much numbness/paresthesias over the last week. Patient is R hand dominant and has to seldom lift during the week or help someone lift. 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 Patient reports not having night pain presently. Patient reports no unexplained weight loss. Pt reports tolerating lying on R side well recently - he hasn't done this much due to being cautious per report. Pt does have intermittent clicking when moving his arm. Patient goals: no pain in his shoulder, able to throw football with his son.  Shoulder trauma: No  Pain quality:  Pain: 0/10 Present, 0/10 Best, 8/10 Worst: Aggravating factors: cross-body adduction, overhead movement/overhead work Easing factors: injection, Rx medication 24 hour pain behavior: none Radiating pain: Yes Numbness/Tingling: Yes, previously felt down length of R upper limb, affecting all 5 digits Prior history of shoulder injury or pain: No Prior history of therapy for shoulder: No Follow-up appointment with MD: Yes, in 6 weeks Dominant hand: Right    OBJECTIVE  MUSCULOSKELETAL: Tremor: Normal Bulk: Normal Tone: Normal  Cervical Screen AROM: WFL and painless with overpressure in all planes Spurlings A (ipsilateral lateral flexion/axial compression): R: Negative L: Negative Hoffman Sign (cervical cord compression): R: Negative L: Negative  Palpation Tenderness to palpation along R bicipital groove, R anterior and middle deltoid   Strength R/L 5/5 Shoulder flexion (anterior deltoid/pec major/coracobrachialis, axillary n. (C5-6) and musculocutaneous n. (C5-7)) 4*/5 Shoulder abduction (deltoid/supraspinatus, axillary/suprascapular n, C5) 5/5 Shoulder external rotation (infraspinatus/teres minor) 5/5 Shoulder internal rotation  (subcapularis/lats/pec major) 5/5 Shoulder horizontal abduction 5/5 Elbow flexion (biceps brachii, brachialis, brachioradialis, musculoskeletal n, C5-6) 5/5 Elbow extension (triceps, radial n, C7)   AROM R/L 149/159 Shoulder flexion 135*/168 Shoulder abduction 81*/83 Shoulder external rotation 65*/70 Shoulder internal rotation *Indicates pain, overpressure performed unless otherwise indicated  PROM R/L 170*/not tested  Shoulder flexion (mild pain end-range) WFL/not tested      Shoulder abduction 85*/not tested  Shoulder external rotation 70/not tested   Shoulder internal rotation *Indicates pain, overpressure performed unless otherwise indicated  Accessory Motions/Glides Glenohumeral: Posterior: R: abnormal, decreased L: not examined Inferior: R: abnormal, decreased L: not examined Anterior: R: normal L: not examined    NEUROLOGICAL:  Sensation Grossly intact to light touch bilateral UE as determined by testing dermatomes C2-T2 Proprioception and hot/cold testing deferred on this date    SPECIAL TESTS  Rotator Cuff  Drop Arm Test: Negative Painful Arc (Pain from 60 to 120 degrees scaption): Positive Infraspinatus Muscle Test: Negative If all 3 tests positive, the probability of a full-thickness rotator cuff tear is 91%  Subacromial Impingement Hawkins-Kennedy: Negative Neer (Block scapula, PROM flexion): Positive Painful Arc (Pain from 60 to 120 degrees  scaption): Positive Empty Can: Negative External Rotation Resistance: Negative Horizontal Adduction: Not examined Scapular Assist: Negative  Labral Tear Biceps Load II (120 elevation, full ER, 90 elbow flexion, full supination, resisted elbow flexion): Negative   Bicep Tendon Pathology Speed (shoulder flexion to 90, external rotation, full elbow extension, and forearm supination with resistance: Positive Yergason's (resisted shoulder ER and supination/biceps tendon pathology): Negative  Shoulder  Instability Anterior Apprehension-Relocation: Positive for pain provocation and relief with relocation     TREATMENT  Therapeutic Exercise - for HEP establishment, discussion on appropriate exercise/activity modification, PT education   Reviewed baseline home exercises and provided handout for [facility] program (see Access Code); tactile cueing and therapist demonstration utilized as needed for carryover of proper technique to HEP.    Patient education:  current condition, anatomy involved, prognosis, plan of care. Discussion on activity modification to prevent flare-up of condition, including limiting repetitive overhead work and heavy lifting temporarily.       ASSESSMENT Pt is a pleasant 46 year old male referred for R shoulder pain with referring diagnoses of R biceps tendonitis and R RTC impingement syndrome. Pt is s/p cortisone injection to subacromial space and biceps tendon sheath with notable improvement following injection. PT examination reveals deficits in shoulder flexion/ABD/ER/IR AROM, decreased GHJ accessory mobility, decreased R shoulder abduction strength, postural changes/FHRS, and local nociceptive anterior shoulder pain. Pt will benefit from PT services to address deficits in strength, mobility, and pain in order to return to full function at home and work with less shoulder pain.     PT Short Term Goals - 10/26/21 1130       PT SHORT TERM GOAL #1   Title Pt will be independent with HEP in order to improve strength and decrease pain in order to improve pain-free function at home and work.    Baseline 10/26/21: Baseline HEP initiated    Time 3    Period Weeks    Status New    Target Date 11/16/21      PT SHORT TERM GOAL #2   Title Patient will have shoulder AROM symmetrical to opposite UE without reproduction of pain as needed for functional reaching, overhead work, self-care, and recovery of function    Baseline 10/26/21: Shoulder AROM R/L Flexion 149/59,  abduction 135/168, ER 81/83, IR 65/70 (with pain in ABD, ER IR)    Time 4    Period Weeks    Status New    Target Date 11/23/21               PT Long Term Goals - 10/26/21 2105       PT LONG TERM GOAL #1   Title Pt will decrease worst pain as reported on NPRS by at least 3 points in order to demonstrate clinically significant reduction in pain.    Baseline 10/26/21: 8/10 pain at worst    Time 6    Period Weeks    Status New    Target Date 12/07/21      PT LONG TERM GOAL #2   Title Patient will demonstrate improved function as evidenced by a score of 73 on FOTO measure for full participation in activities at home and in the community.    Baseline 10/26/21: 57    Time 6    Period Weeks    Status New    Target Date 12/07/21      PT LONG TERM GOAL #3   Title Pt will have strength of deltoid, middle trapezius, and lower trapezius up  to 5/5  in order to demonstrate improvement in strength and function as needed for performing intermittent lifting required for work    Baseline 10/26/21: 4/5 deltoid strength.    Time 6    Period Weeks    Status New    Target Date 12/07/21      PT LONG TERM GOAL #4   Title Patient will perform overhead throw without reproduction of pain as needed for playing catch with his son    Baseline 10/26/21: Pt unable to attain ABD/ER position needed for throwing    Time 6    Period Weeks    Status New    Target Date 12/07/21                     Plan - 10/26/21 2056     Clinical Impression Statement Pt is a pleasant 46 year old male referred for R shoulder pain with referring diagnoses of R biceps tendonitis and R RTC impingement syndrome. Pt is s/p cortisone injection to subacromial space and biceps tendon sheath with notable improvement following injection. PT examination reveals deficits in shoulder flexion/ABD/ER/IR AROM, decreased GHJ accessory mobility, decreased R shoulder abduction strength, postural changes/FHRS, and local nociceptive  anterior shoulder pain. Pt will benefit from PT services to address deficits in strength, mobility, and pain in order to return to full function at home and work with less shoulder pain.    Personal Factors and Comorbidities Comorbidity 2;Time since onset of injury/illness/exacerbation    Comorbidities Vit D deficiency, obesity    Examination-Activity Limitations Lift;Reach Overhead;Carry    Examination-Participation Restrictions Community Activity;Occupation   patient seldom has to lift/carry at work   Stability/Clinical Decision Making Evolving/Moderate complexity    Clinical Decision Making Moderate    Rehab Potential Good    PT Frequency 2x / week    PT Duration 6 weeks    PT Treatment/Interventions Cryotherapy;Electrical Stimulation;Moist Heat;Therapeutic activities;Therapeutic exercise;Neuromuscular re-education;Patient/family education;Manual techniques;Dry needling    PT Next Visit Plan Glenohumeral and scapulothoracic mobility, postural re-education, progressive shoulder ROM, graded introduction of shoulder complex/scapular stabilization    PT Home Exercise Plan Access Code [email or web address]    Consulted and Agree with Plan of Care Patient             Patient will benefit from skilled therapeutic intervention in order to improve the following deficits and impairments:  Decreased range of motion, Decreased strength, Impaired UE functional use, Pain, Postural dysfunction  Visit Diagnosis: Chronic right shoulder pain  Stiffness of right shoulder, not elsewhere classified  Biceps tendinitis, right     Problem List Patient Active Problem List   Diagnosis Date Noted   Biceps tendinitis, right 10/03/2021   Rotator cuff impingement syndrome, right 10/03/2021   Consuela Mimes, PT, DPT #[id number]  Gertie Exon, PT 10/26/2021, 9:40 PM  [facility] [address] [place], [place], [address] Phone: [phone]   Fax:   [phone]  Name: Wilford Merryfield MRN: [id number] Date of Birth: May 01, 1976

## 2021-10-31 ENCOUNTER — Ambulatory Visit: Payer: BC Managed Care – PPO | Admitting: Physical Therapy

## 2021-11-02 ENCOUNTER — Ambulatory Visit: Payer: BC Managed Care – PPO | Admitting: Physical Therapy

## 2021-11-02 ENCOUNTER — Other Ambulatory Visit: Payer: Self-pay

## 2021-11-02 ENCOUNTER — Encounter: Payer: Self-pay | Admitting: Physical Therapy

## 2021-11-02 DIAGNOSIS — M7541 Impingement syndrome of right shoulder: Secondary | ICD-10-CM | POA: Diagnosis not present

## 2021-11-02 DIAGNOSIS — G8929 Other chronic pain: Secondary | ICD-10-CM

## 2021-11-02 DIAGNOSIS — M7521 Bicipital tendinitis, right shoulder: Secondary | ICD-10-CM | POA: Diagnosis not present

## 2021-11-02 DIAGNOSIS — M25611 Stiffness of right shoulder, not elsewhere classified: Secondary | ICD-10-CM | POA: Diagnosis not present

## 2021-11-02 DIAGNOSIS — M25511 Pain in right shoulder: Secondary | ICD-10-CM | POA: Diagnosis not present

## 2021-11-02 NOTE — Therapy (Signed)
[facility] [address] [place], [place], [address] Phone: [phone]   Fax:  [phone]  Physical Therapy Treatment  Patient Details  Name: Bill Smith MRN: [id number] Date of Birth: [birth year] Referring Provider (PT): Joseph Berkshire, MD   Encounter Date: 11/02/2021   PT End of Session - 11/02/21 0852     Visit Number 2    Number of Visits 13    Date for PT Re-Evaluation 12/07/21    Authorization Type [employer]BS max combined 30 visits PT/OT per year    Authorization Time Period Cert 9/37/16-9/67/89    Progress Note Due on Visit 10    PT Start Time 0848    PT Stop Time 0932    PT Time Calculation (min) 44 min    Activity Tolerance Patient tolerated treatment well;No increased pain    Behavior During Therapy WFL for tasks assessed/performed             Past Medical History:  Diagnosis Date   Vitamin D deficiency     History reviewed. No pertinent surgical history.  There were no vitals filed for this visit.   Subjective Assessment - 11/02/21 0849     Subjective Patient reports no significant pain at arrival to PT. Patient reports doing well with his initial home exercise program. Patient reports ongoing clicking in his shoulder, but he does not have remarkable pain at this time.    Pertinent History Patient is a 46 year old male with primary complaint of R shoulder pain. Patient has referring diagnoses of R biceps tendonitis and R rotator cuff impingement syndrome. S/p cortisone injection 10/24/21 that he feels has helped significantly. Patient reports having pop in his R shoulder when he was sleeping on his side. He reports difficulty with lifting his R arm above his head. Patient reports that medication and injection have both helped. Patient reports intermittent numbness affecting his R upper limb from shoulder to fingertips, affecting all 5 fingers per report. Patient reports not experiencing as much numbness/paresthesias  over the last week. Patient is R hand dominant and has to seldom lift during the week or help someone lift. 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 Patient reports not having night pain presently. Patient reports no unexplained weight loss. Pt reports tolerating lying on R side well recently - he hasn't done this much due to being cautious per report. Pt does have intermittent clicking when moving his arm. Patient goals: no pain in his shoulder, able to throw football with his son.    Patient Stated Goals no pain in his shoulder, able to throw football with his son.                Upper body ergometer, 2 minutes forward, 2 minutes backward - for tissue warm-up to improve muscle performance, improved soft tissue mobility/extensibility - unbilled time     TREATMENT   Manual Therapy - for symptom modulation, soft tissue sensitivity and mobility, joint mobility, ROM   R shoulder PROM R glenohumeral joint mobilization, posterior and inferior; STM R anterior/middle deltoid Scapular mobilization; scapular elevation and upward rotation  Rhythmic stabilization with arm at 100 deg forward elevation; x 2 minutes   Neuromuscular Re-education - for upper extremity kinetic chain stability, exercises to promote shoulder complex/scapulothoracic stabilization and improved proprioception   Alternating shoulder taps; x10 alternating, modified plantigrade on raised table Wall clock; with  Green Theraband; x5 around clock   Therapeutic Exercise - for improved soft tissue flexibility and extensibility as needed for ROM, improved strength as needed to improve performance of CKC activities/functional movements  Submaximal isometrics at wall; flexion, ABD; review ed for HEP Serratus slide with foam roll at wall; x10 Pec major stretch; 3x30sec  Pt edu: HEP update and  review   ASSESSMENT Patient arrives with excellent motivation to participate in physical therapy. He demonstrates minimal PROM deficit of R shoulder and grossly WFL active forward elevation and shoulder complex abduction. He has minimal pain today and has responded well to recent injection.  Pt has significant stiffness with scapular elevation and upward rotation and glenohumeral joint posterior and inferior glide. He has no notable increase in pain with Tx today. Pt has remaining deficits in flexion/ABD/ER/IR AROM, decreased GHJ accessory mobility, decreased R shoulder abduction strength, postural changes/FHRS, and local nociceptive anterior shoulder pain. Patient will benefit from continued skilled therapeutic intervention to address the above deficits as needed for improved function and QoL.     PT Short Term Goals - 10/26/21 1130       PT SHORT TERM GOAL #1   Title Pt will be independent with HEP in order to improve strength and decrease pain in order to improve pain-free function at home and work.    Baseline 10/26/21: Baseline HEP initiated    Time 3    Period Weeks    Status New    Target Date 11/16/21      PT SHORT TERM GOAL #2   Title Patient will have shoulder AROM symmetrical to opposite UE without reproduction of pain as needed for functional reaching, overhead work, self-care, and recovery of function    Baseline 10/26/21: Shoulder AROM R/L Flexion 149/59, abduction 135/168, ER 81/83, IR 65/70 (with pain in ABD, ER IR)    Time 4    Period Weeks    Status New    Target Date 11/23/21               PT Long Term Goals - 10/26/21 2105       PT LONG TERM GOAL #1   Title Pt will decrease worst pain as reported on NPRS by at least 3 points in order to demonstrate clinically significant reduction in pain.    Baseline 10/26/21: 8/10 pain at worst    Time 6    Period Weeks    Status New    Target Date 12/07/21      PT LONG TERM GOAL #2   Title Patient will demonstrate  improved function as evidenced by a score of 73 on FOTO measure for full participation in activities at home and in the community.    Baseline 10/26/21: 57    Time 6    Period Weeks    Status New    Target Date 12/07/21      PT LONG TERM GOAL #3   Title Pt will have strength of deltoid, middle trapezius, and lower trapezius up to 5/5  in order to demonstrate improvement in strength and function as needed for performing intermittent lifting required for work    Baseline 10/26/21: 4/5 deltoid strength.    Time 6    Period Weeks    Status New    Target Date 12/07/21      PT LONG TERM GOAL #4   Title Patient will perform overhead throw without reproduction of pain as needed for playing catch with his son  Baseline 10/26/21: Pt unable to attain ABD/ER position needed for throwing    Time 6    Period Weeks    Status New    Target Date 12/07/21                   Plan - 11/02/21 1410     Clinical Impression Statement Patient arrives with excellent motivation to participate in physical therapy. He demonstrates minimal PROM deficit of R shoulder and grossly WFL active forward elevation and shoulder complex abduction. He has minimal pain today and has responded well to recent injection.  Pt has significant stiffness with scapular elevation and upward rotation and glenohumeral joint posterior and inferior glide. He has no notable increase in pain with Tx today. Pt has remaining deficits in flexion/ABD/ER/IR AROM, decreased GHJ accessory mobility, decreased R shoulder abduction strength, postural changes/FHRS, and local nociceptive anterior shoulder pain. Patient will benefit from continued skilled therapeutic intervention to address the above deficits as needed for improved function and QoL.    Personal Factors and Comorbidities Comorbidity 2;Time since onset of injury/illness/exacerbation    Comorbidities Vit D deficiency, obesity    Examination-Activity Limitations Lift;Reach  Overhead;Carry    Examination-Participation Restrictions Community Activity;Occupation   patient seldom has to lift/carry at work   Stability/Clinical Decision Making Evolving/Moderate complexity    Rehab Potential Good    PT Frequency 2x / week    PT Duration 6 weeks    PT Treatment/Interventions Cryotherapy;Electrical Stimulation;Moist Heat;Therapeutic activities;Therapeutic exercise;Neuromuscular re-education;Patient/family education;Manual techniques;Dry needling    PT Next Visit Plan Glenohumeral and scapulothoracic mobility, postural re-education, progressive shoulder ROM, graded introduction of shoulder complex/scapular stabilization    PT Home Exercise Plan Access Code [email or web address]    Consulted and Agree with Plan of Care Patient             Patient will benefit from skilled therapeutic intervention in order to improve the following deficits and impairments:  Decreased range of motion, Decreased strength, Impaired UE functional use, Pain, Postural dysfunction  Visit Diagnosis: Chronic right shoulder pain  Stiffness of right shoulder, not elsewhere classified  Biceps tendinitis, right     Problem List Patient Active Problem List   Diagnosis Date Noted   Biceps tendinitis, right 10/03/2021   Rotator cuff impingement syndrome, right 10/03/2021   Consuela MimesJeremy Dulcy Sida, PT, DPT #[id number]  Gertie ExonJeremy T Kyzer Blowe, PT 11/02/2021, 2:11 PM  [facility] [address] [place], [place], [address] Phone: [phone]   Fax:  [phone]  Name: Bunnie DominoCorey Smith MRN: [id number] Date of Birth: [birth year]

## 2021-11-07 ENCOUNTER — Other Ambulatory Visit: Payer: Self-pay

## 2021-11-07 ENCOUNTER — Encounter: Payer: Self-pay | Admitting: Physical Therapy

## 2021-11-07 ENCOUNTER — Ambulatory Visit: Payer: BC Managed Care – PPO | Admitting: Physical Therapy

## 2021-11-07 DIAGNOSIS — M25511 Pain in right shoulder: Secondary | ICD-10-CM | POA: Diagnosis not present

## 2021-11-07 DIAGNOSIS — M7521 Bicipital tendinitis, right shoulder: Secondary | ICD-10-CM | POA: Diagnosis not present

## 2021-11-07 DIAGNOSIS — M25611 Stiffness of right shoulder, not elsewhere classified: Secondary | ICD-10-CM | POA: Diagnosis not present

## 2021-11-07 DIAGNOSIS — M7541 Impingement syndrome of right shoulder: Secondary | ICD-10-CM | POA: Diagnosis not present

## 2021-11-07 DIAGNOSIS — G8929 Other chronic pain: Secondary | ICD-10-CM | POA: Diagnosis not present

## 2021-11-07 NOTE — Therapy (Signed)
[place] [address] [place], [place], [address] Phone: [phone]   Fax:  [phone]  Physical Therapy Treatment  Patient Details  Name: Bill Smith MRN: [id number] Date of Birth: Aug 27, 1976 Referring Provider (PT): Joseph Berkshire, MD   Encounter Date: 11/07/2021   PT End of Session - 11/07/21 1256     Visit Number 3    Number of Visits 13    Date for PT Re-Evaluation 12/07/21    Authorization Type [employer] max combined 30 visits PT/OT per year    Authorization Time Period Cert 2/45/80-9/98/33    Progress Note Due on Visit 10    PT Start Time 1016    PT Stop Time 1100    PT Time Calculation (min) 44 min    Activity Tolerance Patient tolerated treatment well;No increased pain    Behavior During Therapy WFL for tasks assessed/performed             Past Medical History:  Diagnosis Date   Vitamin D deficiency     History reviewed. No pertinent surgical history.  There were no vitals filed for this visit.   Subjective Assessment - 11/07/21 1021     Subjective Patient reports no pain at arrival to PT. Patient reports no soreness after last visit. He reports no major issues over the weekend.    Pertinent History Patient is a 46 year old male with primary complaint of R shoulder pain. Patient has referring diagnoses of R biceps tendonitis and R rotator cuff impingement syndrome. S/p cortisone injection 10/24/21 that he feels has helped significantly. Patient reports having pop in his R shoulder when he was sleeping on his side. He reports difficulty with lifting his R arm above his head. Patient reports that medication and injection have both helped. Patient reports intermittent numbness affecting his R upper limb from shoulder to fingertips, affecting all 5 fingers per report. Patient reports not experiencing as much numbness/paresthesias over the last week. Patient is R hand dominant and has to seldom lift during the week or  help someone lift. 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 Patient reports not having night pain presently. Patient reports no unexplained weight loss. Pt reports tolerating lying on R side well recently - he hasn't done this much due to being cautious per report. Pt does have intermittent clicking when moving his arm. Patient goals: no pain in his shoulder, able to throw football with his son.    Patient Stated Goals no pain in his shoulder, able to throw football with his son.                Upper body ergometer, 2 minutes forward, 2 minutes backward - for tissue warm-up to improve muscle performance, improved soft tissue mobility/extensibility - unbilled time         TREATMENT     Manual Therapy - for symptom modulation, soft tissue sensitivity and mobility, joint mobility, ROM    R shoulder PROM R glenohumeral joint mobilization, posterior and inferior; STM R anterior/middle deltoid Scapular mobilization; scapular elevation and upward rotation   *not today* Rhythmic stabilization with arm at 100 deg forward elevation; x 2 minutes     Neuromuscular Re-education - for upper extremity kinetic chain stability, exercises to promote shoulder complex/scapulothoracic stabilization and improved proprioception   Wall clock; with Green Theraband; x4 around clock Nautilus double-arm mid-row; 60lbs; 2x10 Bodyblade;  flexion 90 deg; 1x20sec, 1x30sec  *next visit* Alternating shoulder taps; x10 alternating, modified plantigrade on raised table     Therapeutic Exercise - for improved soft tissue flexibility and extensibility as needed for shoulder complex ROM and scapular mobility, specific strengthening with graded increase in load     Serratus slide with foam roll at wall; 2x10, with Red Tband for external rotation moment Pec major stretch; 2x30sec  Pt edu:  reviewed existing home exercises and encouraged continued HEP with precaution for significant loading acutely following steroid injection    *next visit* Standing scaption with Dumbbells;     *not today* Submaximal isometrics at wall; flexion, ABD; review ed for HEP        ASSESSMENT Patient demonstrates improving scapular elevation with moderate upward rotation deficit remaining. He has normal R shoulder PROM without pain reproduction through full range. He is able to notably progress with stabilization work and postural re-edu in clinic without notable complaint of pain. He does report intermittent clicking, but this is not reproduced with unloaded flexion and abduction today. Pt has remaining deficits in flexion/ABD/ER/IR AROM, decreased GHJ accessory mobility, decreased R shoulder abduction strength, postural changes/FHRS, and local nociceptive anterior shoulder pain. Patient will benefit from continued skilled therapeutic intervention to address the above deficits as needed for improved function and QoL.     PT Short Term Goals - 10/26/21 1130       PT SHORT TERM GOAL #1   Title Pt will be independent with HEP in order to improve strength and decrease pain in order to improve pain-free function at home and work.    Baseline 10/26/21: Baseline HEP initiated    Time 3    Period Weeks    Status New    Target Date 11/16/21      PT SHORT TERM GOAL #2   Title Patient will have shoulder AROM symmetrical to opposite UE without reproduction of pain as needed for functional reaching, overhead work, self-care, and recovery of function    Baseline 10/26/21: Shoulder AROM R/L Flexion 149/59, abduction 135/168, ER 81/83, IR 65/70 (with pain in ABD, ER IR)    Time 4    Period Weeks    Status New    Target Date 11/23/21               PT Long Term Goals - 10/26/21 2105       PT LONG TERM GOAL #1   Title Pt will decrease worst pain as reported on NPRS by at least 3 points in order to  demonstrate clinically significant reduction in pain.    Baseline 10/26/21: 8/10 pain at worst    Time 6    Period Weeks    Status New    Target Date 12/07/21      PT LONG TERM GOAL #2   Title Patient will demonstrate improved function as evidenced by a score of 73 on FOTO measure for full participation in activities at home and in the community.    Baseline 10/26/21: 57    Time 6    Period Weeks    Status New    Target Date 12/07/21      PT LONG TERM GOAL #3   Title Pt will have strength of deltoid, middle trapezius, and lower trapezius up to 5/5  in order to demonstrate improvement in strength and function as needed for performing intermittent lifting required for work    Baseline 10/26/21: 4/5 deltoid strength.    Time 6  Period Weeks    Status New    Target Date 12/07/21      PT LONG TERM GOAL #4   Title Patient will perform overhead throw without reproduction of pain as needed for playing catch with his son    Baseline 10/26/21: Pt unable to attain ABD/ER position needed for throwing    Time 6    Period Weeks    Status New    Target Date 12/07/21                   Plan - 11/07/21 1301     Clinical Impression Statement Patient demonstrates improving scapular elevation with moderate upward rotation deficit remaining. He has normal R shoulder PROM without pain reproduction through full range. He is able to notably progress with stabilization work and postural re-edu in clinic without notable complaint of pain. He does report intermittent clicking, but this is not reproduced with unloaded flexion and abduction today. Pt has remaining deficits in flexion/ABD/ER/IR AROM, decreased GHJ accessory mobility, decreased R shoulder abduction strength, postural changes/FHRS, and local nociceptive anterior shoulder pain. Patient will benefit from continued skilled therapeutic intervention to address the above deficits as needed for improved function and QoL.    Personal Factors and  Comorbidities Comorbidity 2;Time since onset of injury/illness/exacerbation    Comorbidities Vit D deficiency, obesity    Examination-Activity Limitations Lift;Reach Overhead;Carry    Examination-Participation Restrictions Community Activity;Occupation   patient seldom has to lift/carry at work   Stability/Clinical Decision Making Evolving/Moderate complexity    Rehab Potential Good    PT Frequency 2x / week    PT Duration 6 weeks    PT Treatment/Interventions Cryotherapy;Electrical Stimulation;Moist Heat;Therapeutic activities;Therapeutic exercise;Neuromuscular re-education;Patient/family education;Manual techniques;Dry needling    PT Next Visit Plan Glenohumeral and scapulothoracic mobility, postural re-education, progressive shoulder ROM, graded introduction of shoulder complex/scapular stabilization    PT Home Exercise Plan Access Code [email or web address]    Consulted and Agree with Plan of Care Patient             Patient will benefit from skilled therapeutic intervention in order to improve the following deficits and impairments:  Decreased range of motion, Decreased strength, Impaired UE functional use, Pain, Postural dysfunction  Visit Diagnosis: Chronic right shoulder pain  Stiffness of right shoulder, not elsewhere classified  Biceps tendinitis, right     Problem List Patient Active Problem List   Diagnosis Date Noted   Biceps tendinitis, right 10/03/2021   Rotator cuff impingement syndrome, right 10/03/2021   Consuela MimesJeremy Marrion Finan, PT, DPT #[id number]  Gertie ExonJeremy T Sherilynn Dieu, PT 11/07/2021, 1:01 PM   [facility] [address] [place], [place], [address] Phone: [phone]   Fax:  [phone]  Name: Bill Smith MRN: [id number] Date of Birth: 09-13-1976

## 2021-11-09 ENCOUNTER — Other Ambulatory Visit: Payer: Self-pay

## 2021-11-09 ENCOUNTER — Encounter: Payer: Self-pay | Admitting: Physical Therapy

## 2021-11-09 ENCOUNTER — Ambulatory Visit: Payer: BC Managed Care – PPO | Admitting: Physical Therapy

## 2021-11-09 DIAGNOSIS — G8929 Other chronic pain: Secondary | ICD-10-CM | POA: Diagnosis not present

## 2021-11-09 DIAGNOSIS — M7521 Bicipital tendinitis, right shoulder: Secondary | ICD-10-CM

## 2021-11-09 DIAGNOSIS — M7541 Impingement syndrome of right shoulder: Secondary | ICD-10-CM | POA: Diagnosis not present

## 2021-11-09 DIAGNOSIS — M25511 Pain in right shoulder: Secondary | ICD-10-CM | POA: Diagnosis not present

## 2021-11-09 DIAGNOSIS — M25611 Stiffness of right shoulder, not elsewhere classified: Secondary | ICD-10-CM | POA: Diagnosis not present

## 2021-11-09 NOTE — Therapy (Signed)
[place] [facility] [address] [place], [place], [address] Phone: [phone]   Fax:  [phone]  Physical Therapy Treatment  Patient Details  Name: Bill Smith MRN: [id number] Date of Birth: 14-Nov-1975 Referring Provider (PT): Joseph BerkshireJason Matthews, MD   Encounter Date: 11/09/2021   PT End of Session - 11/09/21 0850     Visit Number 4    Number of Visits 13    Date for PT Re-Evaluation 12/07/21    Authorization Type [employer] max combined 30 visits PT/OT per year    Authorization Time Period Cert 4/40/34-7/42/591/11/23-3/16/23    Progress Note Due on Visit 10    PT Start Time 0847    PT Stop Time 0934    PT Time Calculation (min) 47 min    Activity Tolerance Patient tolerated treatment well;No increased pain    Behavior During Therapy WFL for tasks assessed/performed             Past Medical History:  Diagnosis Date   Vitamin D deficiency     History reviewed. No pertinent surgical history.  There were no vitals filed for this visit.   Subjective Assessment - 11/10/21 1251     Subjective Patient reports no significant pain this AM. He reports tolerating his previous session well without significant post-exercise soreness. Pt reports no complaints with his current home exercise program.    Pertinent History Patient is a 46 year old male with primary complaint of R shoulder pain. Patient has referring diagnoses of R biceps tendonitis and R rotator cuff impingement syndrome. S/p cortisone injection 10/24/21 that he feels has helped significantly. Patient reports having pop in his R shoulder when he was sleeping on his side. He reports difficulty with lifting his R arm above his head. Patient reports that medication and injection have both helped. Patient reports intermittent numbness affecting his R upper limb from shoulder to fingertips, affecting all 5 fingers per report. Patient reports not experiencing as much numbness/paresthesias over the last week.  Patient is R hand dominant and has to seldom lift during the week or help someone lift. 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 Patient reports not having night pain presently. Patient reports no unexplained weight loss. Pt reports tolerating lying on R side well recently - he hasn't done this much due to being cautious per report. Pt does have intermittent clicking when moving his arm. Patient goals: no pain in his shoulder, able to throw football with his son.    Patient Stated Goals no pain in his shoulder, able to throw football with his son.    Currently in Pain? No/denies                Upper body ergometer, 2 minutes forward, 2 minutes backward - for tissue warm-up to improve muscle performance, improved soft tissue mobility/extensibility - unbilled time         TREATMENT     Manual Therapy - for symptom modulation, soft tissue sensitivity and mobility, joint mobility, ROM    R shoulder PROM R glenohumeral joint mobilization, posterior and inferior; Scapular mobilization; scapular elevation and upward rotation  Rhythmic stabilization with arm at 100 deg forward elevation; x 2 minutes   *not today* STM R anterior/middle deltoid      Neuromuscular Re-education - for upper extremity kinetic chain stability, exercises to promote shoulder complex/scapulothoracic stabilization and improved proprioception  Wall clock; with Green Theraband; x5 around clock Nautilus double-arm mid-row; 60lbs; 2x10, tactile cueing for scapular retraction and depression Bodyblade; flexion 90 deg; 3x30sec Alternating shoulder taps; 2x10 alternating, modified plantigrade on raised table     Therapeutic Exercise - for improved soft tissue flexibility and extensibility as needed for shoulder complex ROM and scapular mobility, specific strengthening with graded increase in load       Serratus slide with foam roll at wall; 2x10, with Red Tband for external rotation moment Pec major stretch; 2x30sec; arms in 90/90 position today Standing scaption with Dumbbells;  6-lb Dbells; 2x8     *not today* Submaximal isometrics at wall; flexion, ABD; review ed for HEP         ASSESSMENT Patient has minimal pain at this time and presents with normalizing shoulder AROM and PROM. He reports intermittent clicking in his shoulder, but does not report significant catching or pain with any activities performed. Patient tolerates progression of standing scaption with resistance well without aggravation of symptoms. Pt has remaining deficits in flexion/ABD/ER/IR AROM, decreased GHJ accessory mobility, decreased R shoulder abduction strength, postural changes/FHRS, and local nociceptive anterior shoulder pain. Patient will benefit from continued skilled therapeutic intervention to address the above deficits as needed for improved function and QoL.      PT Short Term Goals - 10/26/21 1130       PT SHORT TERM GOAL #1   Title Pt will be independent with HEP in order to improve strength and decrease pain in order to improve pain-free function at home and work.    Baseline 10/26/21: Baseline HEP initiated    Time 3    Period Weeks    Status New    Target Date 11/16/21      PT SHORT TERM GOAL #2   Title Patient will have shoulder AROM symmetrical to opposite UE without reproduction of pain as needed for functional reaching, overhead work, self-care, and recovery of function    Baseline 10/26/21: Shoulder AROM R/L Flexion 149/59, abduction 135/168, ER 81/83, IR 65/70 (with pain in ABD, ER IR)    Time 4    Period Weeks    Status New    Target Date 11/23/21               PT Long Term Goals - 10/26/21 2105       PT LONG TERM GOAL #1   Title Pt will decrease worst pain as reported on NPRS by at least 3 points in order to demonstrate clinically significant reduction in pain.     Baseline 10/26/21: 8/10 pain at worst    Time 6    Period Weeks    Status New    Target Date 12/07/21      PT LONG TERM GOAL #2   Title Patient will demonstrate improved function as evidenced by a score of 73 on FOTO measure for full participation in activities at home and in the community.    Baseline 10/26/21: 57    Time 6    Period Weeks    Status New    Target Date 12/07/21      PT LONG TERM GOAL #3   Title Pt will have strength of deltoid, middle trapezius, and lower trapezius up to 5/5  in order to demonstrate improvement in strength and function as needed for performing intermittent lifting required for work    Baseline 10/26/21: 4/5 deltoid strength.    Time 6    Period Weeks  Status New    Target Date 12/07/21      PT LONG TERM GOAL #4   Title Patient will perform overhead throw without reproduction of pain as needed for playing catch with his son    Baseline 10/26/21: Pt unable to attain ABD/ER position needed for throwing    Time 6    Period Weeks    Status New    Target Date 12/07/21                   Plan - 11/10/21 1315     Clinical Impression Statement Patient has minimal pain at this time and presents with normalizing shoulder AROM and PROM. He reports intermittent clicking in his shoulder, but does not report significant catching or pain with any activities performed. Patient tolerates progression of standing scaption with resistance well without aggravation of symptoms. Pt has remaining deficits in flexion/ABD/ER/IR AROM, decreased GHJ accessory mobility, decreased R shoulder abduction strength, postural changes/FHRS, and local nociceptive anterior shoulder pain. Patient will benefit from continued skilled therapeutic intervention to address the above deficits as needed for improved function and QoL.    Personal Factors and Comorbidities Comorbidity 2;Time since onset of injury/illness/exacerbation    Comorbidities Vit D deficiency, obesity     Examination-Activity Limitations Lift;Reach Overhead;Carry    Examination-Participation Restrictions Community Activity;Occupation   patient seldom has to lift/carry at work   Stability/Clinical Decision Making Evolving/Moderate complexity    Rehab Potential Good    PT Frequency 2x / week    PT Duration 6 weeks    PT Treatment/Interventions Cryotherapy;Electrical Stimulation;Moist Heat;Therapeutic activities;Therapeutic exercise;Neuromuscular re-education;Patient/family education;Manual techniques;Dry needling    PT Next Visit Plan Glenohumeral and scapulothoracic mobility, postural re-education, progressive shoulder ROM, graded introduction of shoulder complex/scapular stabilization    PT Home Exercise Plan Access Code [email or web address]    Consulted and Agree with Plan of Care Patient             Patient will benefit from skilled therapeutic intervention in order to improve the following deficits and impairments:  Decreased range of motion, Decreased strength, Impaired UE functional use, Pain, Postural dysfunction  Visit Diagnosis: Chronic right shoulder pain  Stiffness of right shoulder, not elsewhere classified  Biceps tendinitis, right     Problem List Patient Active Problem List   Diagnosis Date Noted   Biceps tendinitis, right 10/03/2021   Rotator cuff impingement syndrome, right 10/03/2021   Consuela Mimes, PT, DPT #[id number]  Gertie Exon, PT 11/10/2021, 1:15 PM  [facility] [address] [place], [place], [address] Phone: [phone]   Fax:  [phone]  Name: Bill Smith MRN: [id number] Date of Birth: 06-08-1976

## 2021-11-14 ENCOUNTER — Encounter: Payer: Self-pay | Admitting: Physical Therapy

## 2021-11-14 ENCOUNTER — Other Ambulatory Visit: Payer: Self-pay

## 2021-11-14 ENCOUNTER — Ambulatory Visit: Payer: BC Managed Care – PPO | Admitting: Physical Therapy

## 2021-11-14 DIAGNOSIS — M7541 Impingement syndrome of right shoulder: Secondary | ICD-10-CM | POA: Diagnosis not present

## 2021-11-14 DIAGNOSIS — M7521 Bicipital tendinitis, right shoulder: Secondary | ICD-10-CM | POA: Diagnosis not present

## 2021-11-14 DIAGNOSIS — G8929 Other chronic pain: Secondary | ICD-10-CM

## 2021-11-14 DIAGNOSIS — M25611 Stiffness of right shoulder, not elsewhere classified: Secondary | ICD-10-CM

## 2021-11-14 DIAGNOSIS — M25511 Pain in right shoulder: Secondary | ICD-10-CM | POA: Diagnosis not present

## 2021-11-14 NOTE — Therapy (Signed)
[facility] [address] [place], [place], [address] Phone: [phone]   Fax:  [phone]  Physical Therapy Treatment  Patient Details  Name: Bill Smith MRN: [id number] Date of Birth: 04/28/1976 Referring Provider (PT): Joseph Berkshire, MD   Encounter Date: 11/14/2021   PT End of Session - 11/14/21 0906     Visit Number 5    Number of Visits 13    Date for PT Re-Evaluation 12/07/21    Authorization Type [employer]BS max combined 30 visits PT/OT per year    Authorization Time Period Cert 6/96/29-03/12/40    Progress Note Due on Visit 10    PT Start Time 0848    PT Stop Time 0929    PT Time Calculation (min) 41 min    Activity Tolerance Patient tolerated treatment well;No increased pain    Behavior During Therapy WFL for tasks assessed/performed             Past Medical History:  Diagnosis Date   Vitamin D deficiency     History reviewed. No pertinent surgical history.  There were no vitals filed for this visit.   Subjective Assessment - 11/14/21 0852     Subjective Patient reports no pain at arrival to PT. Patient reports some comorbid heart burn. Patient reports compliance with HEP. Pt reports no issues over the weekend.    Pertinent History Patient is a 46 year old male with primary complaint of R shoulder pain. Patient has referring diagnoses of R biceps tendonitis and R rotator cuff impingement syndrome. S/p cortisone injection 10/24/21 that he feels has helped significantly. Patient reports having pop in his R shoulder when he was sleeping on his side. He reports difficulty with lifting his R arm above his head. Patient reports that medication and injection have both helped. Patient reports intermittent numbness affecting his R upper limb from shoulder to fingertips, affecting all 5 fingers per report. Patient reports not experiencing as much numbness/paresthesias over the last week. Patient is R hand dominant and has to  seldom lift during the week or help someone lift. 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 Patient reports not having night pain presently. Patient reports no unexplained weight loss. Pt reports tolerating lying on R side well recently - he hasn't done this much due to being cautious per report. Pt does have intermittent clicking when moving his arm. Patient goals: no pain in his shoulder, able to throw football with his son.    Patient Stated Goals no pain in his shoulder, able to throw football with his son.                Upper body ergometer, 2 minutes forward, 2 minutes backward - for tissue warm-up to improve muscle performance, improved soft tissue mobility/extensibility - unbilled time         TREATMENT     Manual Therapy - for symptom modulation, soft tissue sensitivity and mobility, joint mobility, ROM    R shoulder PROM R glenohumeral joint mobilization, posterior and inferior; Scapular mobilization; scapular elevation and upward rotation  [Minimal deficit in shoulder complex abduction, otherwise WNL ROM; mild deficit scapular upward rotation]   *not today* STM R anterior/middle deltoid Rhythmic stabilization with arm at 100 deg forward elevation; x 2 minutes       Neuromuscular Re-education - for upper extremity kinetic chain stability, exercises to promote shoulder complex/scapulothoracic  stabilization and improved proprioception   Bodyblade; flexion 90 deg; 3x30sec Alternating shoulder taps; 2x10 alternating, modified plantigrade on raised table 90/90 ER walkout with Red Tband; 2x8 for promotion of anterior stability and strengthening in overhead position     *not today* Nautilus single-arm mid-row; 50lbs; 2x10, tactile cueing for scapular retraction and depression  Therapeutic Exercise - for improved soft tissue flexibility and extensibility  as needed for shoulder complex ROM and scapular mobility, specific strengthening with graded increase in load      Serratus slide with foam roll at wall; 2x10, with Green Tband for external rotation moment Standing scaption with Dumbbells;  7-lb Dbells; 2x8  Bent-over single-arm row with non-involved arm fixed on edge of table; 10-lb Dbell; 2x8 with 5 sec isometric at neutral shoulder extension  Pt edu: discussed current progress, expectations moving forward with PT, continued activity modification with anticipated return to activity over the next 2 weeks    *not today* Pec major stretch; 2x30sec; arms in 90/90 position today Submaximal isometrics at wall; flexion, ABD; review ed for HEP Wall clock; with Green Theraband; x5 around clock         ASSESSMENT Patient demonstrates normal shoulder complex active and passive ROM at this time. He has no significant pain and has tolerated all activities well since injection with referring physician. He has refrained from heavy lifting or repetitive overhead work recently and has otherwise had no difficulties with self-care or ADLs. Pt is making excellent progress with continued progression of stabilization drills and strengthening. Pt has remaining deficits in R shoulder abduction strength, postural changes/FHRS, and local nociceptive anterior shoulder pain. Patient will benefit from continued skilled therapeutic intervention to address the above deficits as needed for improved function and QoL.        PT Short Term Goals - 10/26/21 1130       PT SHORT TERM GOAL #1   Title Pt will be independent with HEP in order to improve strength and decrease pain in order to improve pain-free function at home and work.    Baseline 10/26/21: Baseline HEP initiated    Time 3    Period Weeks    Status New    Target Date 11/16/21      PT SHORT TERM GOAL #2   Title Patient will have shoulder AROM symmetrical to opposite UE without reproduction of pain as  needed for functional reaching, overhead work, self-care, and recovery of function    Baseline 10/26/21: Shoulder AROM R/L Flexion 149/59, abduction 135/168, ER 81/83, IR 65/70 (with pain in ABD, ER IR)    Time 4    Period Weeks    Status New    Target Date 11/23/21               PT Long Term Goals - 10/26/21 2105       PT LONG TERM GOAL #1   Title Pt will decrease worst pain as reported on NPRS by at least 3 points in order to demonstrate clinically significant reduction in pain.    Baseline 10/26/21: 8/10 pain at worst    Time 6    Period Weeks    Status New    Target Date 12/07/21      PT LONG TERM GOAL #2   Title Patient will demonstrate improved function as evidenced by a score of 73 on FOTO measure for full participation in activities at home and in the community.    Baseline 10/26/21: 57    Time 6  Period Weeks    Status New    Target Date 12/07/21      PT LONG TERM GOAL #3   Title Pt will have strength of deltoid, middle trapezius, and lower trapezius up to 5/5  in order to demonstrate improvement in strength and function as needed for performing intermittent lifting required for work    Baseline 10/26/21: 4/5 deltoid strength.    Time 6    Period Weeks    Status New    Target Date 12/07/21      PT LONG TERM GOAL #4   Title Patient will perform overhead throw without reproduction of pain as needed for playing catch with his son    Baseline 10/26/21: Pt unable to attain ABD/ER position needed for throwing    Time 6    Period Weeks    Status New    Target Date 12/07/21                   Plan - 11/14/21 1248     Clinical Impression Statement Patient demonstrates normal shoulder complex active and passive ROM at this time. He has no significant pain and has tolerated all activities well since injection with referring physician. He has refrained from heavy lifting or repetitive overhead work recently and has otherwise had no difficulties with self-care or  ADLs. Pt is making excellent progress with continued progression of stabilization drills and strengthening. Pt has remaining deficits in R shoulder abduction strength, postural changes/FHRS, and local nociceptive anterior shoulder pain. Patient will benefit from continued skilled therapeutic intervention to address the above deficits as needed for improved function and QoL.    Personal Factors and Comorbidities Comorbidity 2;Time since onset of injury/illness/exacerbation    Comorbidities Vit D deficiency, obesity    Examination-Activity Limitations Lift;Reach Overhead;Carry    Examination-Participation Restrictions Community Activity;Occupation   patient seldom has to lift/carry at work   Stability/Clinical Decision Making Evolving/Moderate complexity    Rehab Potential Good    PT Frequency 2x / week    PT Duration 6 weeks    PT Treatment/Interventions Cryotherapy;Electrical Stimulation;Moist Heat;Therapeutic activities;Therapeutic exercise;Neuromuscular re-education;Patient/family education;Manual techniques;Dry needling    PT Next Visit Plan Continued advancement of shoulder complex stabilization and gradual progressive loading    PT Home Exercise Plan Access Code [email or web address]    Consulted and Agree with Plan of Care Patient             Patient will benefit from skilled therapeutic intervention in order to improve the following deficits and impairments:  Decreased range of motion, Decreased strength, Impaired UE functional use, Pain, Postural dysfunction  Visit Diagnosis: Chronic right shoulder pain  Stiffness of right shoulder, not elsewhere classified  Biceps tendinitis, right     Problem List Patient Active Problem List   Diagnosis Date Noted   Biceps tendinitis, right 10/03/2021   Rotator cuff impingement syndrome, right 10/03/2021   Consuela MimesJeremy Eilah Common, PT, DPT #[id number]  Gertie ExonJeremy T Zykeem Bauserman, PT 11/14/2021, 12:49 PM  [facility] [address] [place], [place], [id number] Phone: [phone]   Fax:  [phone]  Name: Bill Smith MRN: [id number] Date of Birth: 1976/02/21

## 2021-11-16 ENCOUNTER — Ambulatory Visit: Payer: BC Managed Care – PPO | Attending: Family Medicine | Admitting: Physical Therapy

## 2021-11-16 ENCOUNTER — Other Ambulatory Visit: Payer: Self-pay

## 2021-11-16 ENCOUNTER — Encounter: Payer: Self-pay | Admitting: Physical Therapy

## 2021-11-16 DIAGNOSIS — G8929 Other chronic pain: Secondary | ICD-10-CM | POA: Insufficient documentation

## 2021-11-16 DIAGNOSIS — M7521 Bicipital tendinitis, right shoulder: Secondary | ICD-10-CM | POA: Insufficient documentation

## 2021-11-16 DIAGNOSIS — M25611 Stiffness of right shoulder, not elsewhere classified: Secondary | ICD-10-CM | POA: Diagnosis not present

## 2021-11-16 DIAGNOSIS — M25511 Pain in right shoulder: Secondary | ICD-10-CM | POA: Diagnosis not present

## 2021-11-16 NOTE — Therapy (Signed)
[facility] [address] [place], [place], [address] Phone: [phone]   Fax:  [phone]  Physical Therapy Treatment  Patient Details  Name: Bill Smith MRN: [id number] Date of Birth: May 28, 1976 Referring Provider (PT): Rosette Reveal, MD   Encounter Date: 11/16/2021   PT End of Session - 11/16/21 0849     Visit Number 6    Number of Visits 13    Date for PT Re-Evaluation 12/07/21    Authorization Type BCBS max combined 30 visits PT/OT per year    Authorization Time Period Cert [id number]    Progress Note Due on Visit 10    PT Start Time 0845    PT Stop Time 0929    PT Time Calculation (min) 44 min    Activity Tolerance Patient tolerated treatment well;No increased pain    Behavior During Therapy WFL for tasks assessed/performed             Past Medical History:  Diagnosis Date   Vitamin D deficiency     History reviewed. No pertinent surgical history.  There were no vitals filed for this visit.   Subjective Assessment - 11/16/21 0850     Subjective Patient reports no recent issues at arrival to PT. He reports no recent flare-up of shoulder pain. Patient reports doing well this past week. Patient reports no new complaints today. Pt reports doing well with home exercises.    Pertinent History Patient is a 46 year old male with primary complaint of R shoulder pain. Patient has referring diagnoses of R biceps tendonitis and R rotator cuff impingement syndrome. S/p cortisone injection 10/24/21 that he feels has helped significantly. Patient reports having pop in his R shoulder when he was sleeping on his side. He reports difficulty with lifting his R arm above his head. Patient reports that medication and injection have both helped. Patient reports intermittent numbness affecting his R upper limb from shoulder to fingertips, affecting all 5 fingers per report. Patient reports not experiencing as much  numbness/paresthesias over the last week. Patient is R hand dominant and has to seldom lift during the week or help someone lift. 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 Patient reports not having night pain presently. Patient reports no unexplained weight loss. Pt reports tolerating lying on R side well recently - he hasn't done this much due to being cautious per report. Pt does have intermittent clicking when moving his arm. Patient goals: no pain in his shoulder, able to throw football with his son.    Patient Stated Goals no pain in his shoulder, able to throw football with his son.    Currently in Pain? No/denies                  Upper body ergometer, 2 minutes forward, 2 minutes backward - for tissue warm-up to improve muscle performance, improved soft tissue mobility/extensibility - unbilled time         TREATMENT     Manual Therapy - for symptom modulation, soft tissue sensitivity and mobility, joint mobility, ROM    R shoulder PROM R glenohumeral joint mobilization, posterior and inferior; Scapular mobilization; scapular elevation and upward rotation   [Minimal deficit in shoulder complex abduction, otherwise WNL ROM; mild deficit scapular upward rotation. No TTP along anterior, superior, or posterior shoulder complex]   *not today* STM R anterior/middle deltoid Rhythmic stabilization  with arm at 100 deg forward elevation; x 2 minutes       Neuromuscular Re-education - for upper extremity kinetic chain stability, exercises to promote shoulder complex/scapulothoracic stabilization and improved proprioception   Bodyblade; flexion and abduction 90 deg; 3x30sec each Alternating shoulder taps; 2x10 alternating, modified plantigrade on raised table 90/90 ER walkout with Red Tband; 2x10 for promotion of anterior stability and strengthening in overhead  position Nautilus single-arm mid-row; 50lbs for 1x12, 60lbs for 1x12, tactile cueing for scapular retraction and depression      Therapeutic Exercise - for improved soft tissue flexibility and extensibility as needed for shoulder complex ROM and scapular mobility, specific strengthening with graded increase in load     Wall Y (lower trap lift from wall) with Red Theraband; 2x6  Standing scaption with Dumbbells;  8-lb Dbells; 2x8   Pt edu: discussed window of moderately increased risk for strain following steroid injection and expectation for full return to activity after pt is out of this window.      *not today* Serratus slide with foam roll at wall; 2x10, with Green Tband for external rotation moment Bent-over single-arm row with non-involved arm fixed on edge of table; 10-lb Dbell; 2x8 with 5 sec isometric at neutral shoulder extension Pec major stretch; 2x30sec; arms in 90/90 position today Submaximal isometrics at wall; flexion, ABD; review ed for HEP Wall clock; with Green Theraband; x5 around clock         ASSESSMENT Patient has tolerated PRE and advancement of stabilization work well without reproduction of symptoms. He has no pain at this time s/p injection and has full shoulder AROM without reproduction of symptoms or catching. Patient has no major complaints in regard to completing functional reaching, ADLs, or self-care. Pt is making excellent progress at this time and will likely be able to transition to independent home program over next 1-2 weeks. Pt has remaining deficits in R shoulder abduction strength, postural changes/FHRS, and local nociceptive anterior shoulder pain. Patient will benefit from continued skilled therapeutic intervention to address the above deficits as needed for improved function and QoL.       PT Short Term Goals - 10/26/21 1130       PT SHORT TERM GOAL #1   Title Pt will be independent with HEP in order to improve strength and decrease pain in  order to improve pain-free function at home and work.    Baseline 10/26/21: Baseline HEP initiated    Time 3    Period Weeks    Status New    Target Date 11/16/21      PT SHORT TERM GOAL #2   Title Patient will have shoulder AROM symmetrical to opposite UE without reproduction of pain as needed for functional reaching, overhead work, self-care, and recovery of function    Baseline 10/26/21: Shoulder AROM R/L Flexion 149/59, abduction 135/168, ER 81/83, IR 65/70 (with pain in ABD, ER IR)    Time 4    Period Weeks    Status New    Target Date 11/23/21               PT Long Term Goals - 10/26/21 2105       PT LONG TERM GOAL #1   Title Pt will decrease worst pain as reported on NPRS by at least 3 points in order to demonstrate clinically significant reduction in pain.    Baseline 10/26/21: 8/10 pain at worst    Time 6    Period Weeks  Status New    Target Date 12/07/21      PT LONG TERM GOAL #2   Title Patient will demonstrate improved function as evidenced by a score of 73 on FOTO measure for full participation in activities at home and in the community.    Baseline 10/26/21: 57    Time 6    Period Weeks    Status New    Target Date 12/07/21      PT LONG TERM GOAL #3   Title Pt will have strength of deltoid, middle trapezius, and lower trapezius up to 5/5  in order to demonstrate improvement in strength and function as needed for performing intermittent lifting required for work    Baseline 10/26/21: 4/5 deltoid strength.    Time 6    Period Weeks    Status New    Target Date 12/07/21      PT LONG TERM GOAL #4   Title Patient will perform overhead throw without reproduction of pain as needed for playing catch with his son    Baseline 10/26/21: Pt unable to attain ABD/ER position needed for throwing    Time 6    Period Weeks    Status New    Target Date 12/07/21                   Plan - 11/16/21 [id number]     Clinical Impression Statement Patient has tolerated  PRE and advancement of stabilization work well without reproduction of symptoms. He has no pain at this time s/p injection and has full shoulder AROM without reproduction of symptoms or catching. Patient has no major complaints in regard to completing functional reaching, ADLs, or self-care. Pt is making excellent progress at this time and will likely be able to transition to independent home program over next 1-2 weeks. Pt has remaining deficits in R shoulder abduction strength, postural changes/FHRS, and local nociceptive anterior shoulder pain. Patient will benefit from continued skilled therapeutic intervention to address the above deficits as needed for improved function and QoL.    Personal Factors and Comorbidities Comorbidity 2;Time since onset of injury/illness/exacerbation    Comorbidities Vit D deficiency, obesity    Examination-Activity Limitations Lift;Reach Overhead;Carry    Examination-Participation Restrictions Community Activity;Occupation   patient seldom has to lift/carry at work   Stability/Clinical Decision Making Evolving/Moderate complexity    Rehab Potential Good    PT Frequency 2x / week    PT Duration 6 weeks    PT Treatment/Interventions Cryotherapy;Electrical Stimulation;Moist Heat;Therapeutic activities;Therapeutic exercise;Neuromuscular re-education;Patient/family education;Manual techniques;Dry needling    PT Next Visit Plan Continued advancement of shoulder complex stabilization and gradual progressive loading    PT Home Exercise Plan Access Code [email or web address]    Consulted and Agree with Plan of Care Patient             Patient will benefit from skilled therapeutic intervention in order to improve the following deficits and impairments:  Decreased range of motion, Decreased strength, Impaired UE functional use, Pain, Postural dysfunction  Visit Diagnosis: Chronic right shoulder pain  Stiffness of right shoulder, not elsewhere classified  Biceps tendinitis,  right     Problem List Patient Active Problem List   Diagnosis Date Noted   Biceps tendinitis, right 10/03/2021   Rotator cuff impingement syndrome, right 10/03/2021   Valentina Gu, PT, DPT [id number]  Eilleen Kempf, PT 11/16/2021, 9:39 AM  [facility] [address] [place], [place], [address] Phone: [phone]  Fax:  [phone]  Name: Bill Smith MRN: [id number] Date of Birth: 1976/06/09

## 2021-11-21 ENCOUNTER — Encounter: Payer: Self-pay | Admitting: Physical Therapy

## 2021-11-21 ENCOUNTER — Ambulatory Visit: Payer: BC Managed Care – PPO | Admitting: Physical Therapy

## 2021-11-21 ENCOUNTER — Other Ambulatory Visit: Payer: Self-pay

## 2021-11-21 DIAGNOSIS — M7521 Bicipital tendinitis, right shoulder: Secondary | ICD-10-CM | POA: Diagnosis not present

## 2021-11-21 DIAGNOSIS — G8929 Other chronic pain: Secondary | ICD-10-CM | POA: Diagnosis not present

## 2021-11-21 DIAGNOSIS — M25611 Stiffness of right shoulder, not elsewhere classified: Secondary | ICD-10-CM

## 2021-11-21 DIAGNOSIS — M25511 Pain in right shoulder: Secondary | ICD-10-CM | POA: Diagnosis not present

## 2021-11-21 NOTE — Patient Instructions (Signed)
°  °  Upper body ergometer, 2 minutes forward, 2 minutes backward - for tissue warm-up to improve muscle performance, improved soft tissue mobility/extensibility - unbilled time         TREATMENT     Manual Therapy - for symptom modulation, soft tissue sensitivity and mobility, joint mobility, ROM    R shoulder PROM R glenohumeral joint mobilization, posterior and inferior; Scapular mobilization; scapular elevation and upward rotation   [Minimal deficit in shoulder complex abduction, otherwise WNL ROM; mild deficit scapular upward rotation. No TTP along anterior, superior, or posterior shoulder complex]   *not today* STM R anterior/middle deltoid Rhythmic stabilization with arm at 100 deg forward elevation; x 2 minutes       Neuromuscular Re-education - for upper extremity kinetic chain stability, exercises to promote shoulder complex/scapulothoracic stabilization and improved proprioception   Bodyblade; flexion and abduction 90 deg; 3x30sec each Alternating shoulder taps; 2x10 alternating, modified plantigrade on raised table 90/90 ER walkout with Red Tband; 2x10 for promotion of anterior stability and strengthening in overhead position Nautilus single-arm mid-row; 50lbs for 1x12, 60lbs for 1x12, tactile cueing for scapular retraction and depression       Therapeutic Exercise - for improved soft tissue flexibility and extensibility as needed for shoulder complex ROM and scapular mobility, specific strengthening with graded increase in load      Wall Y (lower trap lift from wall) with Red Theraband; 2x6   Standing scaption with Dumbbells;  8-lb Dbells; 2x8    Pt edu: HEP update and review     *not today* Serratus slide with foam roll at wall; 2x10, with Green Tband for external rotation moment Bent-over single-arm row with non-involved arm fixed on edge of table; 10-lb Dbell; 2x8 with 5 sec isometric at neutral shoulder extension Pec major stretch; 2x30sec; arms in 90/90  position today Submaximal isometrics at wall; flexion, ABD; review ed for HEP Wall clock; with Green Theraband; x5 around clock         ASSESSMENT Patient has tolerated PRE and advancement of stabilization work well without reproduction of symptoms. He has no pain at this time s/p injection and has full shoulder AROM without reproduction of symptoms or catching. Patient has no major complaints in regard to completing functional reaching, ADLs, or self-care. Pt is making excellent progress at this time and will likely be able to transition to independent home program over next 1-2 weeks. Pt has remaining deficits in R shoulder abduction strength, postural changes/FHRS, and local nociceptive anterior shoulder pain. Patient will benefit from continued skilled therapeutic intervention to address the above deficits as needed for improved function and QoL.

## 2021-11-21 NOTE — Therapy (Addendum)
[facility] [address] [place], [place], [address] Phone: [phone]   Fax:  [phone]  Physical Therapy Treatment  Patient Details  Name: Bill Smith MRN: [id number] Date of Birth: 09/10/1976 Referring Provider (PT): Joseph Berkshire, MD   Encounter Date: 11/21/2021   PT End of Session - 11/21/21 0855     Visit Number 7    Number of Visits 13    Date for PT Re-Evaluation 12/07/21    Authorization Type [employer]BS max combined 30 visits PT/OT per year    Authorization Time Period Cert 6/96/29-03/12/40    Progress Note Due on Visit 10    PT Start Time 0850    PT Stop Time 0934    PT Time Calculation (min) 44 min    Activity Tolerance Patient tolerated treatment well;No increased pain    Behavior During Therapy WFL for tasks assessed/performed             Past Medical History:  Diagnosis Date   Vitamin D deficiency     History reviewed. No pertinent surgical history.  There were no vitals filed for this visit.   Subjective Assessment - 11/21/21 0851     Subjective Pt denies pain upon arrival. Pt reports no issues with shoulder over the weekend. Pt recalls no new soreness after previous session.    Pertinent History Patient is a 46 year old male with primary complaint of R shoulder pain. Patient has referring diagnoses of R biceps tendonitis and R rotator cuff impingement syndrome. S/p cortisone injection 10/24/21 that he feels has helped significantly. Patient reports having pop in his R shoulder when he was sleeping on his side. He reports difficulty with lifting his R arm above his head. Patient reports that medication and injection have both helped. Patient reports intermittent numbness affecting his R upper limb from shoulder to fingertips, affecting all 5 fingers per report. Patient reports not experiencing as much numbness/paresthesias over the last week. Patient is R hand dominant and has to seldom lift during the week or  help someone lift. 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 Patient reports not having night pain presently. Patient reports no unexplained weight loss. Pt reports tolerating lying on R side well recently - he hasn't done this much due to being cautious per report. Pt does have intermittent clicking when moving his arm. Patient goals: no pain in his shoulder, able to throw football with his son.    Patient Stated Goals no pain in his shoulder, able to throw football with his son.                  Upper body ergometer, 2 minutes forward, 2 minutes backward - for tissue warm-up to improve muscle performance, improved soft tissue mobility/extensibility - unbilled time         TREATMENT     Manual Therapy - for symptom modulation, soft tissue sensitivity and mobility, joint mobility, ROM    R shoulder PROM, Flexion, abduction, ER and IR     *not today* STM R anterior/middle deltoid Rhythmic stabilization with arm at 100 deg forward elevation; x 2 minutes       Neuromuscular Re-education - for upper extremity kinetic chain stability, exercises to promote shoulder complex/scapulothoracic stabilization and improved proprioception   Bodyblade; flexion and abduction 90 deg; 5 x 30s each - with tactile cueing for scapular setting 90/90  ER Wall bounces 2 x 90/90 ER walkout with Red Tband; 2 x 10 for promotion of anterior stability and strengthening in overhead position  *Not today* Nautilus single-arm mid-row; 50lbs for 1x12, 60lbs for 1x12, tactile cueing for scapular retraction and depression      Therapeutic Exercise - for improved soft tissue flexibility and extensibility as needed for shoulder complex ROM and scapular mobility, specific strengthening with graded increase in load     Wall Y (lower trap lift from wall) with Red Theraband; 2 x 10 Standing  scaption with Dumbbells;  8-lb Dbells; 2 x 10  Box lifts 15lb x 10, 20lb x 5; abdominal to overhead height on metal shelf   Pt edu: discussed window of moderately increased risk for strain following steroid injection and expectation for full return to activity after pt is out of this window.      *not today* Serratus slide with foam roll at wall; 2x10, with Green Tband for external rotation moment Bent-over single-arm row with non-involved arm fixed on edge of table; 10-lb Dbell; 2x8 with 5 sec isometric at neutral shoulder extension Pec major stretch; 2x30sec; arms in 90/90 position today Submaximal isometrics at wall; flexion, ABD; review ed for HEP Wall clock; with Green Theraband; x5 around clock         ASSESSMENT Focused session on progressive strengthening, proprioception and stabilization of the shoulder and scapula. RUE PROM flexion, abduction. ER and IR have improved and pt had no complaints of pain or catching. Progressed to functional motion with box lifts, pt denied any issues. Pt has been making excellent progress and will likely be able to transition to an independent HEP soon. Pt would benefit form continued skilled PT services to further address any remaining ROM and strength deficits.        PT Short Term Goals - 10/26/21 1130       PT SHORT TERM GOAL #1   Title Pt will be independent with HEP in order to improve strength and decrease pain in order to improve pain-free function at home and work.    Baseline 10/26/21: Baseline HEP initiated    Time 3    Period Weeks    Status New    Target Date 11/16/21      PT SHORT TERM GOAL #2   Title Patient will have shoulder AROM symmetrical to opposite UE without reproduction of pain as needed for functional reaching, overhead work, self-care, and recovery of function    Baseline 10/26/21: Shoulder AROM R/L Flexion 149/59, abduction 135/168, ER 81/83, IR 65/70 (with pain in ABD, ER IR)    Time 4    Period Weeks    Status  New    Target Date 11/23/21               PT Long Term Goals - 10/26/21 2105       PT LONG TERM GOAL #1   Title Pt will decrease worst pain as reported on NPRS by at least 3 points in order to demonstrate clinically significant reduction in pain.    Baseline 10/26/21: 8/10 pain at worst    Time 6    Period Weeks    Status New    Target Date 12/07/21      PT LONG TERM GOAL #2   Title Patient will demonstrate improved function as evidenced by a score of 73 on FOTO measure for full participation in activities at home and in the community.    Baseline 10/26/21:  57    Time 6    Period Weeks    Status New    Target Date 12/07/21      PT LONG TERM GOAL #3   Title Pt will have strength of deltoid, middle trapezius, and lower trapezius up to 5/5  in order to demonstrate improvement in strength and function as needed for performing intermittent lifting required for work    Baseline 10/26/21: 4/5 deltoid strength.    Time 6    Period Weeks    Status New    Target Date 12/07/21      PT LONG TERM GOAL #4   Title Patient will perform overhead throw without reproduction of pain as needed for playing catch with his son    Baseline 10/26/21: Pt unable to attain ABD/ER position needed for throwing    Time 6    Period Weeks    Status New    Target Date 12/07/21                   Plan - 11/21/21 0855     Clinical Impression Statement Focused session on progressive strengthening, proprioception and stabilization of the shoulder and scapula. RUE PROM flexion, abduction. ER and IR have improved and pt had no complaints of pain or catching. Progressed to functional motion with box lifts, pt denied any issues. Pt has been making excellent progress and will likely be able to transition to an independent HEP soon. Pt would benefit form continued skilled PT services to further address any remaining ROM and strength deficits.    Personal Factors and Comorbidities Comorbidity 2;Time since  onset of injury/illness/exacerbation    Comorbidities Vit D deficiency, obesity    Examination-Activity Limitations Lift;Reach Overhead;Carry    Examination-Participation Restrictions Community Activity;Occupation   patient seldom has to lift/carry at work   Stability/Clinical Decision Making Evolving/Moderate complexity    Rehab Potential Good    PT Frequency 2x / week    PT Duration 6 weeks    PT Treatment/Interventions Cryotherapy;Electrical Stimulation;Moist Heat;Therapeutic activities;Therapeutic exercise;Neuromuscular re-education;Patient/family education;Manual techniques;Dry needling    PT Next Visit Plan Continued advancement of shoulder complex stabilization and gradual progressive loading    PT Home Exercise Plan Access Code [email or web address]    Consulted and Agree with Plan of Care Patient             Patient will benefit from skilled therapeutic intervention in order to improve the following deficits and impairments:  Decreased range of motion, Decreased strength, Impaired UE functional use, Pain, Postural dysfunction  Visit Diagnosis: Chronic right shoulder pain  Stiffness of right shoulder, not elsewhere classified  Biceps tendinitis, right     Problem List Patient Active Problem List   Diagnosis Date Noted   Biceps tendinitis, right 10/03/2021   Rotator cuff impingement syndrome, right 10/03/2021   Consuela Mimes, PT, DPT [phone]  [facility] SPT [place], Student-PT 11/21/2021, 9:39 AM  [facility] [address] [place], [place], [address] Phone: [phone]   Fax:  [phone]  Name: Naoto Colindres MRN: [id number] Date of Birth: June 14, 1976

## 2021-11-23 ENCOUNTER — Other Ambulatory Visit: Payer: Self-pay

## 2021-11-23 ENCOUNTER — Ambulatory Visit: Payer: BC Managed Care – PPO | Admitting: Physical Therapy

## 2021-11-23 ENCOUNTER — Encounter: Payer: Self-pay | Admitting: Physical Therapy

## 2021-11-23 DIAGNOSIS — M7521 Bicipital tendinitis, right shoulder: Secondary | ICD-10-CM

## 2021-11-23 DIAGNOSIS — G8929 Other chronic pain: Secondary | ICD-10-CM | POA: Diagnosis not present

## 2021-11-23 DIAGNOSIS — M25611 Stiffness of right shoulder, not elsewhere classified: Secondary | ICD-10-CM

## 2021-11-23 DIAGNOSIS — M25511 Pain in right shoulder: Secondary | ICD-10-CM

## 2021-11-23 NOTE — Therapy (Signed)
[facility] [address] [place], [place], [address] Phone: [phone]   Fax:  [phone]  Physical Therapy Treatment/ Physical Therapy Progress Note   Dates of reporting period  10/26/21   to   11/23/21   Patient Details  Name: Tylik Treese MRN: [id number] Date of Birth: 10-Nov-1975 Referring Provider (PT): Rosette Reveal, MD   Encounter Date: 11/23/2021   PT End of Session - 11/23/21 0856     Visit Number 8    Number of Visits 13    Date for PT Re-Evaluation 12/07/21    Authorization Type [employer]BS max combined 30 visits PT/OT per year    Authorization Time Period Cert 1/32/44-0/10/27    Progress Note Due on Visit 10    PT Start Time 0851    PT Stop Time 0933    PT Time Calculation (min) 42 min    Activity Tolerance Patient tolerated treatment well;No increased pain    Behavior During Therapy WFL for tasks assessed/performed             Past Medical History:  Diagnosis Date   Vitamin D deficiency     History reviewed. No pertinent surgical history.  There were no vitals filed for this visit.   Subjective Assessment - 11/23/21 0858     Subjective Pt reports 95% SANE score today. No pain at arrival and no pain reports over the last week. He reports tolerating cross-body adduction to shave underarms well recently. He denies issues with self-care or reaching at this time. Pt feels that he has progressed well. Patient voies no new complaints at arrival.    Pertinent History Patient is a 46 year old male with primary complaint of R shoulder pain. Patient has referring diagnoses of R biceps tendonitis and R rotator cuff impingement syndrome. S/p cortisone injection 10/24/21 that he feels has helped significantly. Patient reports having pop in his R shoulder when he was sleeping on his side. He reports difficulty with lifting his R arm above his head. Patient reports that medication and injection have both helped. Patient reports  intermittent numbness affecting his R upper limb from shoulder to fingertips, affecting all 5 fingers per report. Patient reports not experiencing as much numbness/paresthesias over the last week. Patient is R hand dominant and has to seldom lift during the week or help someone lift. 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 Patient reports not having night pain presently. Patient reports no unexplained weight loss. Pt reports tolerating lying on R side well recently - he hasn't done this much due to being cautious per report. Pt does have intermittent clicking when moving his arm. Patient goals: no pain in his shoulder, able to throw football with his son.    Patient Stated Goals no pain in his shoulder, able to throw football with his son.               OBJECTIVE  Palpation No tenderness to palpation today      Strength R/L 5/5 Shoulder flexion (anterior deltoid/pec major/coracobrachialis, axillary n. (C5-6) and musculocutaneous n. (C5-7)) 5/5 Shoulder abduction (deltoid/supraspinatus, axillary/suprascapular n, C5) 5/5 Shoulder external rotation (infraspinatus/teres minor) 5/5 Shoulder internal rotation (subcapularis/lats/pec major) 5/5 Shoulder horizontal abduction 5/5 Elbow flexion (biceps brachii, brachialis, brachioradialis, musculoskeletal n, C5-6)     AROM R/L 163/159 Shoulder flexion 168/168 Shoulder abduction 82/86 Shoulder external rotation 59/64 Shoulder internal rotation *Indicates  pain, overpressure performed unless otherwise indicated   PROM R/L 175/not tested  Shoulder flexion (mild pain end-range) 156*/not tested      Shoulder abduction 95/not tested  Shoulder external rotation WNL/not tested   Shoulder internal rotation *Indicates pain, overpressure performed unless otherwise indicated           TREATMENT   Upper body ergometer, 2  minutes forward, 2 minutes backward - for tissue warm-up to improve muscle performance, improved soft tissue mobility/extensibility - unbilled time    Therapeutic Activities  Re-assessment performed (see above and updated Goal section below)    Manual Therapy - for symptom modulation, soft tissue sensitivity and mobility, joint mobility, ROM    R shoulder PROM, Flexion, abduction, ER and IR       *not today* STM R anterior/middle deltoid Rhythmic stabilization with arm at 100 deg forward elevation; x 2 minutes       Neuromuscular Re-education - for upper extremity kinetic chain stability, exercises to promote shoulder complex/scapulothoracic stabilization and improved proprioception   *Not today* Bodyblade; flexion and abduction 90 deg; 5 x 30s each - with tactile cueing for scapular setting 90/90 ER Wall bounces 2 x 63mn 90/90 ER walkout with Red Tband; 2 x 10 for promotion of anterior stability and strengthening in overhead position Nautilus single-arm mid-row; 50lbs for 1x12, 60lbs for 1x12, tactile cueing for scapular retraction and depression       Therapeutic Exercise - for improved soft tissue flexibility and extensibility as needed for shoulder complex ROM and scapular mobility, specific strengthening with graded increase in load   Box lifts 25-lb; abdominal to overhead height on metal shelf, and floor to waist; x5 each  Throw in hallway with therapist; at 12 feet; unweighted ball and 2.2 pound ball; x 15 each  Pt edu: HEP update and review    *next visit* Standing scaption with Dumbbells;  8-lb Dbells; 2 x 10      *not today* Wall Y (lower trap lift from wall) with Red Theraband; 2 x 10 Serratus slide with foam roll at wall; 2x10, with Green Tband for external rotation moment Bent-over single-arm row with non-involved arm fixed on edge of table; 10-lb Dbell; 2x8 with 5 sec isometric at neutral shoulder extension Pec major stretch; 2x30sec; arms in 90/90 position  today Submaximal isometrics at wall; flexion, ABD; review ed for HEP Wall clock; with Green Theraband; x5 around clock         ASSESSMENT Patient demonstrates normal ROM without pain, apprehension, or locking/catching. He has excellent strength and is able to perform box lifting from floor to waist and abdominal height to overhead with sound mechanics and no shoulder pain. Patient has met FOTO score. He has met ROM and strength goals. Pt is able to perform brief trial of throwing with no weight and 2.2 pounds today without reproduction of shoulder pain or catching/clicking/locking. Patient has no pain at this time and has surpassed long-term NPRS goal. Patient has met established goals and will be ready for discharge next week pending no regression in his condition with resumption of full activity. Pt would benefit form continued skilled PT services to further address any remaining ROM and strength deficits.        PT Short Term Goals - 11/23/21 0944       PT SHORT TERM GOAL #1   Title Pt will be independent with HEP in order to improve strength and decrease pain in order to improve pain-free function at home and work.  Baseline 10/26/21: Baseline HEP initiated.   11/23/21: Compliant and IND with HEP, pt able to recount exercises and pt demonstrates good understanding of technique.    Time 3    Period Weeks    Status Achieved    Target Date 11/16/21      PT SHORT TERM GOAL #2   Title Patient will have shoulder AROM symmetrical to opposite UE without reproduction of pain as needed for functional reaching, overhead work, self-care, and recovery of function    Baseline 10/26/21: Shoulder AROM R/L Flexion 149/59, abduction 135/168, ER 81/83, IR 65/70 (with pain in ABD, ER IR).    11/23/21: Shoulder AROM R/L Flexion 163/159, abduction 168/168, ER 82/86, IR 59/64   AROM symmetrical or at least within 5 deg of contralateral UE   Time 4    Period Weeks    Status Achieved    Target Date 11/23/21                PT Long Term Goals - 11/23/21 0900       PT LONG TERM GOAL #1   Title Pt will decrease worst pain as reported on NPRS by at least 3 points in order to demonstrate clinically significant reduction in pain.    Baseline 10/26/21: 8/10 pain at worst.  11/23/21: no pain this past week.    Time 6    Period Weeks    Status Achieved    Target Date 12/07/21      PT LONG TERM GOAL #2   Title Patient will demonstrate improved function as evidenced by a score of 73 on FOTO measure for full participation in activities at home and in the community.    Baseline 10/26/21: 57.   11/23/21: 83.    Time 6    Period Weeks    Status Achieved    Target Date 12/07/21      PT LONG TERM GOAL #3   Title Pt will have strength of deltoid, middle trapezius, and lower trapezius up to 5/5  in order to demonstrate improvement in strength and function as needed for performing intermittent lifting required for work    Baseline 10/26/21: 4/5 deltoid strength.   11/23/21: 5/5 for all muscles tested today    Time 6    Period Weeks    Status Achieved    Target Date 12/07/21      PT LONG TERM GOAL #4   Title Patient will perform overhead throw without reproduction of pain as needed for playing catch with his son    Baseline 10/26/21: Pt unable to attain ABD/ER position needed for throwing.   11/23/21: able to perform throwing at 20 feet with unweighted ball and 2.2-lb ball with no pain.    Time 6    Period Weeks    Status Achieved    Target Date 12/07/21                   Plan - 11/24/21 0945     Clinical Impression Statement Patient demonstrates normal ROM without pain, apprehension, or locking/catching. He has excellent strength and is able to perform box lifting from floor to waist and abdominal height to overhead with sound mechanics and no shoulder pain. Patient has met FOTO score. He has met ROM and strength goals. Pt is able to perform brief trial of throwing with no weight and 2.2 pounds  today without reproduction of shoulder pain or catching/clicking/locking. Patient has no pain at this time and has surpassed long-term  NPRS goal. Patient has met established goals and will be ready for discharge next week pending no regression in his condition with resumption of full activity. Pt would benefit form continued skilled PT services to further address any remaining ROM and strength deficits.    Personal Factors and Comorbidities Comorbidity 2;Time since onset of injury/illness/exacerbation    Comorbidities Vit D deficiency, obesity    Examination-Activity Limitations Lift;Reach Overhead;Carry    Examination-Participation Restrictions Community Activity;Occupation   patient seldom has to lift/carry at work   Stability/Clinical Decision Making Evolving/Moderate complexity    Rehab Potential Good    PT Frequency 2x / week    PT Duration 6 weeks    PT Treatment/Interventions Cryotherapy;Electrical Stimulation;Moist Heat;Therapeutic activities;Therapeutic exercise;Neuromuscular re-education;Patient/family education;Manual techniques;Dry needling    PT Next Visit Plan Continued advancement of shoulder complex stabilization and gradual progressive loading, focusing on recovery of function with last week of PT and plan to discharge week of 11/28/21    PT Home Exercise Plan Access Code [email or web address]    Consulted and Agree with Plan of Care Patient             Patient will benefit from skilled therapeutic intervention in order to improve the following deficits and impairments:  Decreased range of motion, Decreased strength, Impaired UE functional use, Pain, Postural dysfunction  Visit Diagnosis: Chronic right shoulder pain  Stiffness of right shoulder, not elsewhere classified  Biceps tendinitis, right     Problem List Patient Active Problem List   Diagnosis Date Noted   Biceps tendinitis, right 10/03/2021   Rotator cuff impingement syndrome, right 10/03/2021   Valentina Gu, PT,  DPT #[id number]  Eilleen Kempf, PT 11/24/2021, 9:46 AM  [facility] [address] [place], [place], [address] Phone: [phone]   Fax:  [phone]  Name: Cherry Wittwer MRN: [id number] Date of Birth: [birth year]

## 2021-11-28 ENCOUNTER — Ambulatory Visit: Payer: BC Managed Care – PPO | Admitting: Physical Therapy

## 2021-11-28 ENCOUNTER — Other Ambulatory Visit: Payer: Self-pay

## 2021-11-28 ENCOUNTER — Encounter: Payer: Self-pay | Admitting: Physical Therapy

## 2021-11-28 DIAGNOSIS — M7521 Bicipital tendinitis, right shoulder: Secondary | ICD-10-CM | POA: Diagnosis not present

## 2021-11-28 DIAGNOSIS — M25511 Pain in right shoulder: Secondary | ICD-10-CM | POA: Diagnosis not present

## 2021-11-28 DIAGNOSIS — M25611 Stiffness of right shoulder, not elsewhere classified: Secondary | ICD-10-CM | POA: Diagnosis not present

## 2021-11-28 DIAGNOSIS — G8929 Other chronic pain: Secondary | ICD-10-CM | POA: Diagnosis not present

## 2021-11-28 NOTE — Therapy (Signed)
[facility] [address] [place], [place], [address] Phone: [phone]   Fax:  [phone]  Physical Therapy Treatment  Patient Details  Name: Bill Smith MRN: [id number] Date of Birth: [birth year] Referring Provider (PT): Rosette Reveal, MD   Encounter Date: 11/28/2021   PT End of Session - 11/28/21 0851     Visit Number 9    Number of Visits 13    Date for PT Re-Evaluation 12/07/21    Authorization Type [employer] max combined 30 visits PT/OT per year    Authorization Time Period Cert 6/96/29-03/12/40, progress note 11/23/21    Progress Note Due on Visit 10    PT Start Time 0845    PT Stop Time 0925    PT Time Calculation (min) 40 min    Activity Tolerance Patient tolerated treatment well;No increased pain    Behavior During Therapy WFL for tasks assessed/performed             Past Medical History:  Diagnosis Date   Vitamin D deficiency     History reviewed. No pertinent surgical history.  There were no vitals filed for this visit.   Subjective Assessment - 11/28/21 1542     Subjective Patient reports no pain at arrival. Pt has tolerated recent progression of activities in PT well. He reports no recent issues with IADLs, self-care, reaching, or work-related activities.    Pertinent History Patient is a 46 year old male with primary complaint of R shoulder pain. Patient has referring diagnoses of R biceps tendonitis and R rotator cuff impingement syndrome. S/p cortisone injection 10/24/21 that he feels has helped significantly. Patient reports having pop in his R shoulder when he was sleeping on his side. He reports difficulty with lifting his R arm above his head. Patient reports that medication and injection have both helped. Patient reports intermittent numbness affecting his R upper limb from shoulder to fingertips, affecting all 5 fingers per report. Patient reports not experiencing as much numbness/paresthesias over the  last week. Patient is R hand dominant and has to seldom lift during the week or help someone lift. 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 Patient reports not having night pain presently. Patient reports no unexplained weight loss. Pt reports tolerating lying on R side well recently - he hasn't done this much due to being cautious per report. Pt does have intermittent clicking when moving his arm. Patient goals: no pain in his shoulder, able to throw football with his son.    Patient Stated Goals no pain in his shoulder, able to throw football with his son.               TREATMENT    Therapeutic Exercise - for improved soft tissue flexibility and extensibility as needed for shoulder complex ROM and scapular mobility, specific strengthening with graded increase in load    Upper body ergometer, 2 minutes forward, 2 minutes backward - for tissue warm-up to improve muscle performance, improved soft tissue mobility/extensibility - interval subjective information gathered, 2 minutes unbilled  Box lifts 25-lb; abdominal to overhead height on metal shelf; x3  Standing scaption with Dumbbells;  9-lb Dbells; 2 x 8  Patient education: discussed return to activity and completing work duties as tolerated pending no return of pain, anticipated discharge next visit     *not today* Throw in hallway with therapist; at 12 feet; unweighted  ball and 2.2 pound ball; x 15 each Wall Y (lower trap lift from wall) with Red Theraband; 2 x 10 Serratus slide with foam roll at wall; 2x10, with Green Tband for external rotation moment Bent-over single-arm row with non-involved arm fixed on edge of table; 10-lb Dbell; 2x8 with 5 sec isometric at neutral shoulder extension Pec major stretch; 2x30sec; arms in 90/90 position today Submaximal isometrics at wall; flexion, ABD; review ed for HEP Wall  clock; with Green Theraband; x5 around clock       Manual Therapy - for symptom modulation, soft tissue sensitivity and mobility, joint mobility, ROM    R shoulder PROM, Flexion, abduction, ER and IR   [no significant pain or gross motion deficits at this time, only 3 min spent on hands-on manual therapy]    *not today* STM R anterior/middle deltoid Rhythmic stabilization with arm at 100 deg forward elevation; x 2 minutes    Neuromuscular Re-education - for upper extremity kinetic chain stability, exercises to promote shoulder complex/scapulothoracic stabilization and improved proprioception    Bodyblade; flexion and abduction 90 deg; 5 x 30s each - with tactile cueing for scapular setting 90/90 throw to rebounder; 2.2 pounds; 2x15 90/90 ER walkout with Nyoka Cowden Tband; 2 x 10 for promotion of anterior stability and strengthening in overhead position Nautilus single-arm mid-row; 70lbs; 2x10 tactile cueing for scapular retraction and depression        ASSESSMENT Patient demonstrates normal AROM in clinic today without pain. He has been able to progress with functional lifting activity, advanced stabilization drills with shoulder elevated, and overhead throwing without significant pain (as needed for return to throwing with his son). Patient has made excellent progress and is approaching readiness for discharge. Anticipate discharge from formal PT intervention next visit given goals met per re-assessment last visit, no pain, and continued good progress over a month out from steroid injection. Pt would benefit form continued skilled PT services for full recovery of function and transition to HEP over the following visit.         PT Short Term Goals - 11/23/21 0944       PT SHORT TERM GOAL #1   Title Pt will be independent with HEP in order to improve strength and decrease pain in order to improve pain-free function at home and work.    Baseline 10/26/21: Baseline HEP initiated.    11/23/21: Compliant and IND with HEP, pt able to recount exercises and pt demonstrates good understanding of technique.    Time 3    Period Weeks    Status Achieved    Target Date 11/16/21      PT SHORT TERM GOAL #2   Title Patient will have shoulder AROM symmetrical to opposite UE without reproduction of pain as needed for functional reaching, overhead work, self-care, and recovery of function    Baseline 10/26/21: Shoulder AROM R/L Flexion 149/59, abduction 135/168, ER 81/83, IR 65/70 (with pain in ABD, ER IR).    11/23/21: Shoulder AROM R/L Flexion 163/159, abduction 168/168, ER 82/86, IR 59/64   AROM symmetrical or at least within 5 deg of contralateral UE   Time 4    Period Weeks    Status Achieved    Target Date 11/23/21               PT Long Term Goals - 11/23/21 0900       PT LONG TERM GOAL #1   Title Pt will decrease worst pain as reported on NPRS  by at least 3 points in order to demonstrate clinically significant reduction in pain.    Baseline 10/26/21: 8/10 pain at worst.  11/23/21: no pain this past week.    Time 6    Period Weeks    Status Achieved    Target Date 12/07/21      PT LONG TERM GOAL #2   Title Patient will demonstrate improved function as evidenced by a score of 73 on FOTO measure for full participation in activities at home and in the community.    Baseline 10/26/21: 57.   11/23/21: 83.    Time 6    Period Weeks    Status Achieved    Target Date 12/07/21      PT LONG TERM GOAL #3   Title Pt will have strength of deltoid, middle trapezius, and lower trapezius up to 5/5  in order to demonstrate improvement in strength and function as needed for performing intermittent lifting required for work    Baseline 10/26/21: 4/5 deltoid strength.   11/23/21: 5/5 for all muscles tested today    Time 6    Period Weeks    Status Achieved    Target Date 12/07/21      PT LONG TERM GOAL #4   Title Patient will perform overhead throw without reproduction of pain as needed  for playing catch with his son    Baseline 10/26/21: Pt unable to attain ABD/ER position needed for throwing.   11/23/21: able to perform throwing at 20 feet with unweighted ball and 2.2-lb ball with no pain.    Time 6    Period Weeks    Status Achieved    Target Date 12/07/21                   Plan - 11/28/21 1551     Clinical Impression Statement Patient demonstrates normal AROM in clinic today without pain. He has been able to progress with functional lifting activity, advanced stabilization drills with shoulder elevated, and overhead throwing without significant pain (as needed for return to throwing with his son). Patient has made excellent progress and is approaching readiness for discharge. Anticipate discharge from formal PT intervention next visit given goals met per re-assessment last visit, no pain, and continued good progress over a month out from steroid injection. Pt would benefit form continued skilled PT services for full recovery of function and transition to HEP over the following visit.    Personal Factors and Comorbidities Comorbidity 2;Time since onset of injury/illness/exacerbation    Comorbidities Vit D deficiency, obesity    Examination-Activity Limitations Lift;Reach Overhead;Carry    Examination-Participation Restrictions Community Activity;Occupation   patient seldom has to lift/carry at work   Stability/Clinical Decision Making Evolving/Moderate complexity    Rehab Potential Good    PT Frequency 2x / week    PT Duration 6 weeks    PT Treatment/Interventions Cryotherapy;Electrical Stimulation;Moist Heat;Therapeutic activities;Therapeutic exercise;Neuromuscular re-education;Patient/family education;Manual techniques;Dry needling    PT Next Visit Plan Continued advancement of shoulder complex stabilization and gradual progressive loading, focusing on recovery of function with last week of PT and plan to discharge week of 11/28/21    PT Home Exercise Plan Access  Code [email or web address]    Consulted and Agree with Plan of Care Patient             Patient will benefit from skilled therapeutic intervention in order to improve the following deficits and impairments:  Decreased range of motion, Decreased strength, Impaired UE functional use,  Pain, Postural dysfunction  Visit Diagnosis: Chronic right shoulder pain  Stiffness of right shoulder, not elsewhere classified  Biceps tendinitis, right     Problem List Patient Active Problem List   Diagnosis Date Noted   Biceps tendinitis, right 10/03/2021   Rotator cuff impingement syndrome, right 10/03/2021   Valentina Gu, PT, DPT #[id number]  Eilleen Kempf, PT 11/28/2021, 3:52 PM  [facility] [address] [place], [place], [address] Phone: [phone]   Fax:  [phone]  Name: Bill Smith MRN: [id number] Date of Birth: 1976/07/06

## 2021-11-30 ENCOUNTER — Ambulatory Visit: Payer: BC Managed Care – PPO

## 2021-11-30 ENCOUNTER — Other Ambulatory Visit: Payer: Self-pay

## 2021-11-30 DIAGNOSIS — M25611 Stiffness of right shoulder, not elsewhere classified: Secondary | ICD-10-CM

## 2021-11-30 DIAGNOSIS — G8929 Other chronic pain: Secondary | ICD-10-CM | POA: Diagnosis not present

## 2021-11-30 DIAGNOSIS — M7521 Bicipital tendinitis, right shoulder: Secondary | ICD-10-CM

## 2021-11-30 DIAGNOSIS — M25511 Pain in right shoulder: Secondary | ICD-10-CM | POA: Diagnosis not present

## 2021-11-30 NOTE — Therapy (Signed)
[place] [facility] [address] [place], [place], [address] Phone: [phone]   Fax:  [phone]  Physical Therapy Treatment  Patient Details  Name: Bill Smith MRN: [id number] Date of Birth: Nov 09, 1975 Referring Provider (PT): Joseph Berkshire, MD   Encounter Date: 11/30/2021   PT End of Session - 11/30/21 0854     Visit Number 10    Number of Visits 13    Date for PT Re-Evaluation 12/07/21    Authorization Type [employer] max combined 30 visits PT/OT per year    Authorization Time Period Cert 6/96/29-03/12/40, progress note 11/23/21    Progress Note Due on Visit 10    PT Start Time 0845    PT Stop Time 0930    PT Time Calculation (min) 45 min    Activity Tolerance Patient tolerated treatment well;No increased pain    Behavior During Therapy WFL for tasks assessed/performed             Past Medical History:  Diagnosis Date   Vitamin D deficiency     No past surgical history on file.  There were no vitals filed for this visit.   Subjective Assessment - 11/30/21 0853     Subjective Pt reports his shoulder is "feeling great".  He feels comfortable with dc from PT today.    Pertinent History Patient is a 46 year old male with primary complaint of R shoulder pain. Patient has referring diagnoses of R biceps tendonitis and R rotator cuff impingement syndrome. S/p cortisone injection 10/24/21 that he feels has helped significantly. Patient reports having pop in his R shoulder when he was sleeping on his side. He reports difficulty with lifting his R arm above his head. Patient reports that medication and injection have both helped. Patient reports intermittent numbness affecting his R upper limb from shoulder to fingertips, affecting all 5 fingers per report. Patient reports not experiencing as much numbness/paresthesias over the last week. Patient is R hand dominant and has to seldom lift during the week or help someone lift. 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 Patient reports not having night pain presently. Patient reports no unexplained weight loss. Pt reports tolerating lying on R side well recently - he hasn't done this much due to being cautious per report. Pt does have intermittent clicking when moving his arm. Patient goals: no pain in his shoulder, able to throw football with his son.    Patient Stated Goals no pain in his shoulder, able to throw football with his son.                TREATMENT     Therapeutic Exercise - for improved soft tissue flexibility and extensibility as needed for shoulder complex ROM and scapular mobility, specific strengthening with graded increase in load    Upper body ergometer, 2 minutes forward, 2 minutes backward - for tissue warm-up to improve muscle performance, improved soft tissue mobility/extensibility - interval subjective information gathered, 2 minutes unbilled   Box lifts 25-lb; abdominal to overhead height on metal shelf; x3   Standing scaption with Dumbbells;  9-lb Dbells; 2 x 8   Patient education: discussed return to activity and completing work duties as tolerated pending no return of pain, anticipated discharge next visit     *not today* Throw in hallway with therapist; at 12 feet; unweighted ball and 2.2 pound ball; x 15  each Wall Y (lower trap lift from wall) with Red Theraband; 2 x 10 Serratus slide with foam roll at wall; 2x10, with Green Tband for external rotation moment Bent-over single-arm row with non-involved arm fixed on edge of table; 10-lb Dbell; 2x8 with 5 sec isometric at neutral shoulder extension Pec major stretch; 2x30sec; arms in 90/90 position today Submaximal isometrics at wall; flexion, ABD; review ed for HEP Wall clock; with Green Theraband; x5 around clock         Manual Therapy - for symptom modulation, soft tissue  sensitivity and mobility, joint mobility, ROM    R shoulder PROM, Flexion, abduction, ER and IR   [no significant pain or gross motion deficits at this time, only 3 min spent on hands-on manual therapy]     *not today* STM R anterior/middle deltoid Rhythmic stabilization with arm at 100 deg forward elevation; x 2 minutes     Neuromuscular Re-education - for upper extremity kinetic chain stability, exercises to promote shoulder complex/scapulothoracic stabilization and improved proprioception     Bodyblade; flexion and abduction 90 deg; 5 x 30s each - with tactile cueing for scapular setting 90/90 throw to rebounder; 2.2 pounds; 2x15 Chest press to rebounder; 2.2 pounds; 2x15 90/90 ER walkout with [place] Si Tband; 2 x 10 for promotion of anterior stability and strengthening in overhead position Nautilus single-arm mid-row; 70lbs; 2x10 tactile cueing for scapular retraction and depression          PT Short Term Goals - 11/23/21 0944       PT SHORT TERM GOAL #1   Title Pt will be independent with HEP in order to improve strength and decrease pain in order to improve pain-free function at home and work.    Baseline 10/26/21: Baseline HEP initiated.   11/23/21: Compliant and IND with HEP, pt able to recount exercises and pt demonstrates good understanding of technique.    Time 3    Period Weeks    Status Achieved    Target Date 11/16/21      PT SHORT TERM GOAL #2   Title Patient will have shoulder AROM symmetrical to opposite UE without reproduction of pain as needed for functional reaching, overhead work, self-care, and recovery of function    Baseline 10/26/21: Shoulder AROM R/L Flexion 149/59, abduction 135/168, ER 81/83, IR 65/70 (with pain in ABD, ER IR).    11/23/21: Shoulder AROM R/L Flexion 163/159, abduction 168/168, ER 82/86, IR 59/64   AROM symmetrical or at least within 5 deg of contralateral UE   Time 4    Period Weeks    Status Achieved    Target Date 11/23/21                PT Long Term Goals - 11/23/21 0900       PT LONG TERM GOAL #1   Title Pt will decrease worst pain as reported on NPRS by at least 3 points in order to demonstrate clinically significant reduction in pain.    Baseline 10/26/21: 8/10 pain at worst.  11/23/21: no pain this past week.    Time 6    Period Weeks    Status Achieved    Target Date 12/07/21      PT LONG TERM GOAL #2   Title Patient will demonstrate improved function as evidenced by a score of 73 on FOTO measure for full participation in activities at home and in the community.    Baseline 10/26/21: 57.   11/23/21: 83.  Time 6    Period Weeks    Status Achieved    Target Date 12/07/21      PT LONG TERM GOAL #3   Title Pt will have strength of deltoid, middle trapezius, and lower trapezius up to 5/5  in order to demonstrate improvement in strength and function as needed for performing intermittent lifting required for work    Baseline 10/26/21: 4/5 deltoid strength.   11/23/21: 5/5 for all muscles tested today    Time 6    Period Weeks    Status Achieved    Target Date 12/07/21      PT LONG TERM GOAL #4   Title Patient will perform overhead throw without reproduction of pain as needed for playing catch with his son    Baseline 10/26/21: Pt unable to attain ABD/ER position needed for throwing.   11/23/21: able to perform throwing at 20 feet with unweighted ball and 2.2-lb ball with no pain.    Time 6    Period Weeks    Status Achieved    Target Date 12/07/21                   Plan - 11/30/21 0854     Clinical Impression Statement Pt able to perform therapeutic exercises including advanced stabilizatoin drills and functional lifting activities without report of any shoulder pain today.  He reports being able to perform his HEP independently and does not have additional.  Plan for DC per primary PT POC as discussed at last session.    Personal Factors and Comorbidities Comorbidity 2;Time since onset of  injury/illness/exacerbation    Comorbidities Vit D deficiency, obesity    Examination-Activity Limitations Lift;Reach Overhead;Carry    Examination-Participation Restrictions Community Activity;Occupation   patient seldom has to lift/carry at work   Stability/Clinical Decision Making Evolving/Moderate complexity    Rehab Potential Good    PT Frequency 2x / week    PT Duration 6 weeks    PT Treatment/Interventions Cryotherapy;Electrical Stimulation;Moist Heat;Therapeutic activities;Therapeutic exercise;Neuromuscular re-education;Patient/family education;Manual techniques;Dry needling    PT Next Visit Plan Continued advancement of shoulder complex stabilization and gradual progressive loading, focusing on recovery of function with last week of PT and plan to discharge week of 11/28/21    PT Home Exercise Plan Access Code [email or web address]    Consulted and Agree with Plan of Care Patient             Patient will benefit from skilled therapeutic intervention in order to improve the following deficits and impairments:  Decreased range of motion, Decreased strength, Impaired UE functional use, Pain, Postural dysfunction  Visit Diagnosis: Chronic right shoulder pain  Stiffness of right shoulder, not elsewhere classified  Biceps tendinitis, right     Problem List Patient Active Problem List   Diagnosis Date Noted   Biceps tendinitis, right 10/03/2021   Rotator cuff impingement syndrome, right 10/03/2021    Ardine Bjork, PT 11/30/2021, 9:38 AM Max Fickle, PT, DPT  [phone]  [facility] [address] [place], [place], [address] Phone: [phone]   Fax:  [phone]  Name: Bill Smith MRN: [id number] Date of Birth: 09-23-1976

## 2021-12-05 ENCOUNTER — Other Ambulatory Visit: Payer: Self-pay

## 2021-12-05 ENCOUNTER — Ambulatory Visit (INDEPENDENT_AMBULATORY_CARE_PROVIDER_SITE_OTHER): Payer: BC Managed Care – PPO | Admitting: Family Medicine

## 2021-12-05 ENCOUNTER — Encounter: Payer: Self-pay | Admitting: Family Medicine

## 2021-12-05 VITALS — BP 122/82 | HR 87 | Ht 69.0 in | Wt 341.0 lb

## 2021-12-05 DIAGNOSIS — Z23 Encounter for immunization: Secondary | ICD-10-CM | POA: Diagnosis not present

## 2021-12-05 DIAGNOSIS — R7989 Other specified abnormal findings of blood chemistry: Secondary | ICD-10-CM | POA: Diagnosis not present

## 2021-12-05 DIAGNOSIS — Z0000 Encounter for general adult medical examination without abnormal findings: Secondary | ICD-10-CM | POA: Insufficient documentation

## 2021-12-05 DIAGNOSIS — Z1159 Encounter for screening for other viral diseases: Secondary | ICD-10-CM

## 2021-12-05 DIAGNOSIS — Z13220 Encounter for screening for lipoid disorders: Secondary | ICD-10-CM

## 2021-12-05 DIAGNOSIS — K219 Gastro-esophageal reflux disease without esophagitis: Secondary | ICD-10-CM

## 2021-12-05 DIAGNOSIS — M7521 Bicipital tendinitis, right shoulder: Secondary | ICD-10-CM

## 2021-12-05 DIAGNOSIS — M7541 Impingement syndrome of right shoulder: Secondary | ICD-10-CM

## 2021-12-05 DIAGNOSIS — Z1211 Encounter for screening for malignant neoplasm of colon: Secondary | ICD-10-CM

## 2021-12-05 NOTE — Progress Notes (Signed)
Annual Physical Exam Visit  Patient Information:  Patient ID: Bill Smith, male DOB: 08-28-1976 Age: 46 y.o. MRN: [id number]   Subjective:   CC: Annual Physical Exam  HPI:  Bill Smith is here for their annual physical.  I reviewed the past medical history, family history, social history, surgical history, and allergies today and changes were made as necessary.  Please see the problem list section below for additional details.  Past Medical History: Past Medical History:  Diagnosis Date   Vitamin D deficiency    Past Surgical History: History reviewed. No pertinent surgical history. Family History: Family History  Problem Relation Age of Onset   Hypertension Mother    Hyperlipidemia Mother    Drug abuse Father    Diabetes Father    Hypertension Father    Liver cancer Father    Mental illness Sister    Scleroderma Maternal Grandmother    Lung cancer Paternal Grandmother    Allergies: Allergies  Allergen Reactions   Tessalon [Benzonatate] Itching   Health Maintenance: Health Maintenance  Topic Date Due   Hepatitis C Screening  Never done   COLONOSCOPY (Pts 45-22yrs Insurance coverage will need to be confirmed)  Never done   INFLUENZA VACCINE  01/13/2022 (Originally 05/16/2021)   COVID-19 Vaccine (1) 01/13/2022 (Originally 03/07/1977)   TETANUS/TDAP  12/06/2031   HIV Screening  Completed   HPV VACCINES  Aged Out    HM Colonoscopy           Overdue - COLONOSCOPY (Pts 45-21yrs Insurance coverage will need to be confirmed) (Every 10 Years) Overdue - never done    No completion history exists for this topic.           Medications: No current outpatient medications on file prior to visit.   No current facility-administered medications on file prior to visit.    Review of Systems: No headache, visual changes, nausea, vomiting, diarrhea, constipation, dizziness, abdominal pain, skin rash, fevers, chills, night sweats, swollen lymph nodes, weight loss,  chest pain, body aches, joint swelling, muscle aches, shortness of breath, mood changes, visual or auditory hallucinations reported. +reflux  Objective:   Vitals:   12/05/21 0834  BP: 122/82  Pulse: 87  SpO2: 97%   Vitals:   12/05/21 0834  Weight: (!) 341 lb (154.7 kg)  Height: 5\' 9"  (1.753 m)   Body mass index is 50.36 kg/m.  General: Well Developed, obese, well nourished, and in no acute distress.  Neuro: Alert and oriented x3, extra-ocular muscles intact, sensation grossly intact. Cranial nerves II through XII are grossly intact, motor, sensory, and coordinative functions are intact. HEENT: Normocephalic, atraumatic, pupils equal round reactive to light, neck supple, no masses, no lymphadenopathy, thyroid nonpalpable. Oropharynx, nasopharynx, external ear canals are unremarkable. Skin: Warm and dry, no rashes noted.  Cardiac: Regular rate and rhythm, no murmurs rubs or gallops. No peripheral edema. Pulses symmetric. Respiratory: Clear to auscultation bilaterally. Not using accessory muscles, speaking in full sentences.  Abdominal: Soft, nontender, nondistended, positive bowel sounds, no masses, no organomegaly. Musculoskeletal: Shoulder, elbow, wrist, hip, knee, ankle stable, and with full range of motion.  Impression and Recommendations:   The patient was counselled, risk factors were discussed, and anticipatory guidance given.  Biceps tendinitis, right Patient has noted complete resolution of his right shoulder pain following ultrasound-guided corticosteroid injections to the right biceps tendon sheath and subacromial space performed on 10/24/2021 coupled with dedicated physical therapy.  He was advised to perform maintenance home exercises and  follow-up on as-needed basis for this issue.  Rotator cuff impingement syndrome, right See additional assessment(s) for plan details.  Gastroesophageal reflux disease Brought up today, has been noting this for quite some time,  occurring roughly weekly, noted primarily in the evenings when he eats meals later.  At this stage he has not performed any treatments other than as needed Tums.  I provided patient oriented education materials in regards to lifestyle modifications and if these fail to provide adequate response, can discuss H2 blockers versus PPI treatments.  Annual physical exam Annual examination completed, risk stratification labs ordered, anticipatory guidance provided.  We will follow labs once resulted.  Referral to GI for colonoscopy placed, Tdap vaccination administered.  Screen for colon cancer Referral to gastroenterology placed for routine colon cancer screening.  Need for diphtheria-tetanus-pertussis (Tdap) vaccine Tdap vaccination administered today.  Orders & Medications Medications: No orders of the defined types were placed in this encounter.  Orders Placed This Encounter  Procedures   Tdap vaccine greater than or equal to 7yo IM   Hepatitis C Antibody   Comprehensive Metabolic Panel (CMET)   Lipid Panel With LDL/HDL Ratio   Apo A1 + B + Ratio   TSH   Vitamin D (25 hydroxy)   CBC with Differential   Ambulatory referral to Gastroenterology     Return in about 1 year (around 12/05/2022) for annual physical.    [name], MD   [place]

## 2021-12-05 NOTE — Assessment & Plan Note (Signed)
Tdap vaccination administered today. 

## 2021-12-05 NOTE — Assessment & Plan Note (Signed)
Referral to gastroenterology placed for routine colon cancer screening.

## 2021-12-05 NOTE — Assessment & Plan Note (Signed)
Brought up today, has been noting this for quite some time, occurring roughly weekly, noted primarily in the evenings when he eats meals later.  At this stage he has not performed any treatments other than as needed Tums.  I provided patient oriented education materials in regards to lifestyle modifications and if these fail to provide adequate response, can discuss H2 blockers versus PPI treatments.

## 2021-12-05 NOTE — Assessment & Plan Note (Signed)
Patient has noted complete resolution of his right shoulder pain following ultrasound-guided corticosteroid injections to the right biceps tendon sheath and subacromial space performed on 10/24/2021 coupled with dedicated physical therapy.  He was advised to perform maintenance home exercises and follow-up on as-needed basis for this issue.

## 2021-12-05 NOTE — Assessment & Plan Note (Signed)
Annual examination completed, risk stratification labs ordered, anticipatory guidance provided.  We will follow labs once resulted.  Referral to GI for colonoscopy placed, Tdap vaccination administered.

## 2021-12-05 NOTE — Assessment & Plan Note (Signed)
See additional assessment(s) for plan details. 

## 2021-12-05 NOTE — Patient Instructions (Signed)
-   Obtain fasting labs with orders provided (can have water or black coffee but otherwise no food or drink x 8 hours before labs) - Referral coordinator will contact you in regards to following up with gastroenterology - Review information on preventing reflux - Review information provided - Attend eye doctor annually, dentist every 6 months, work towards or maintain 30 minutes of moderate intensity physical activity at least 5 days per week, and consume a balanced diet - Return in 1 year for physical - Contact us for any questions between now and then

## 2021-12-06 ENCOUNTER — Other Ambulatory Visit: Payer: Self-pay

## 2021-12-06 DIAGNOSIS — R7989 Other specified abnormal findings of blood chemistry: Secondary | ICD-10-CM | POA: Diagnosis not present

## 2021-12-06 DIAGNOSIS — Z1159 Encounter for screening for other viral diseases: Secondary | ICD-10-CM | POA: Diagnosis not present

## 2021-12-06 DIAGNOSIS — Z1211 Encounter for screening for malignant neoplasm of colon: Secondary | ICD-10-CM

## 2021-12-06 DIAGNOSIS — Z13220 Encounter for screening for lipoid disorders: Secondary | ICD-10-CM | POA: Diagnosis not present

## 2021-12-06 DIAGNOSIS — R7309 Other abnormal glucose: Secondary | ICD-10-CM | POA: Diagnosis not present

## 2021-12-06 DIAGNOSIS — Z0000 Encounter for general adult medical examination without abnormal findings: Secondary | ICD-10-CM | POA: Diagnosis not present

## 2021-12-06 MED ORDER — PEG 3350-KCL-NA BICARB-NACL 420 G PO SOLR: 4000.0000 mL | Freq: Once | ORAL | 0 refills | Status: AC

## 2021-12-06 NOTE — Progress Notes (Signed)
Gastroenterology Pre-Procedure Review  Request Date: 12/21/2021 Requesting Physician: Dr. Allegra Lai  PATIENT REVIEW QUESTIONS: The patient responded to the following health history questions as indicated:    1. Are you having any GI issues? no 2. Do you have a personal history of Polyps? no 3. Do you have a family history of Colon Cancer or Polyps? no 4. Diabetes Mellitus? no 5. Joint replacements in the past 12 months?no 6. Major health problems in the past 3 months?no 7. Any artificial heart valves, MVP, or defibrillator?no    MEDICATIONS & ALLERGIES:    Patient reports the following regarding taking any anticoagulation/antiplatelet therapy:   Plavix, Coumadin, Eliquis, Xarelto, Lovenox, Pradaxa, Brilinta, or Effient? no Aspirin? no  Patient confirms/reports the following medications:  No current outpatient medications on file.   No current facility-administered medications for this visit.    Patient confirms/reports the following allergies:  Allergies  Allergen Reactions   Tessalon [Benzonatate] Itching    No orders of the defined types were placed in this encounter.   AUTHORIZATION INFORMATION Primary Insurance: 1D#: Group #:  Secondary Insurance: 1D#: Group #:  SCHEDULE INFORMATION: Date: 12/21/2021 Time: Location: [facility]

## 2021-12-07 ENCOUNTER — Encounter: Payer: Self-pay | Admitting: Family Medicine

## 2021-12-07 LAB — COMPREHENSIVE METABOLIC PANEL
ALT: 17 IU/L (ref 0–44)
AST: 14 IU/L (ref 0–40)
Albumin/Globulin Ratio: 1.3 (ref 1.2–2.2)
Albumin: 4.1 g/dL (ref 4.0–5.0)
Alkaline Phosphatase: 75 IU/L (ref 44–121)
BUN/Creatinine Ratio: 9 (ref 9–20)
BUN: 10 mg/dL (ref 6–24)
Bilirubin Total: 0.4 mg/dL (ref 0.0–1.2)
CO2: 23 mmol/L (ref 20–29)
Calcium: 9.4 mg/dL (ref 8.7–10.2)
Chloride: 102 mmol/L (ref 96–106)
Creatinine, Ser: 1.14 mg/dL (ref 0.76–1.27)
Globulin, Total: 3.1 g/dL (ref 1.5–4.5)
Glucose: 103 mg/dL — ABNORMAL HIGH (ref 70–99)
Potassium: 5 mmol/L (ref 3.5–5.2)
Sodium: 140 mmol/L (ref 134–144)
Total Protein: 7.2 g/dL (ref 6.0–8.5)
eGFR: 80 mL/min/{1.73_m2} (ref >59)

## 2021-12-07 LAB — LIPID PANEL WITH LDL/HDL RATIO
Cholesterol, Total: 211 mg/dL — ABNORMAL HIGH (ref 100–199)
HDL: 56 mg/dL (ref >39)
LDL Chol Calc (NIH): 140 mg/dL — ABNORMAL HIGH (ref 0–99)
LDL/HDL Ratio: 2.5 ratio (ref 0.0–3.6)
Triglycerides: 86 mg/dL (ref 0–149)
VLDL Cholesterol Cal: 15 mg/dL (ref 5–40)

## 2021-12-07 LAB — CBC WITH DIFFERENTIAL/PLATELET
Basophils Absolute: 0 10*3/uL (ref 0.0–0.2)
Basos: 0 %
EOS (ABSOLUTE): 0.1 10*3/uL (ref 0.0–0.4)
Eos: 1 %
Hematocrit: 42.8 % (ref 37.5–51.0)
Hemoglobin: 14.1 g/dL (ref 13.0–17.7)
Immature Grans (Abs): 0 10*3/uL (ref 0.0–0.1)
Immature Granulocytes: 0 %
Lymphocytes Absolute: 1.7 10*3/uL (ref 0.7–3.1)
Lymphs: 22 %
MCH: 29.7 pg (ref 26.6–33.0)
MCHC: 32.9 g/dL (ref 31.5–35.7)
MCV: 90 fL (ref 79–97)
Monocytes Absolute: 0.6 10*3/uL (ref 0.1–0.9)
Monocytes: 8 %
Neutrophils Absolute: 5.4 10*3/uL (ref 1.4–7.0)
Neutrophils: 69 %
Platelets: 362 10*3/uL (ref 150–450)
RBC: 4.75 x10E6/uL (ref 4.14–5.80)
RDW: 11.5 % — ABNORMAL LOW (ref 11.6–15.4)
WBC: 7.8 10*3/uL (ref 3.4–10.8)

## 2021-12-07 LAB — VITAMIN D 25 HYDROXY (VIT D DEFICIENCY, FRACTURES): Vit D, 25-Hydroxy: 6.3 ng/mL — ABNORMAL LOW (ref 30.0–100.0)

## 2021-12-07 LAB — APO A1 + B + RATIO
Apolipo. B/A-1 Ratio: 0.7 ratio (ref 0.0–0.7)
Apolipoprotein A-1: 144 mg/dL (ref 101–178)
Apolipoprotein B: 104 mg/dL — ABNORMAL HIGH (ref <90)

## 2021-12-07 LAB — HEPATITIS C ANTIBODY: Hep C Virus Ab: NONREACTIVE

## 2021-12-07 LAB — TSH: TSH: 1.46 u[IU]/mL (ref 0.450–4.500)

## 2021-12-09 ENCOUNTER — Other Ambulatory Visit: Payer: Self-pay

## 2021-12-09 DIAGNOSIS — R7309 Other abnormal glucose: Secondary | ICD-10-CM | POA: Insufficient documentation

## 2021-12-09 DIAGNOSIS — R7989 Other specified abnormal findings of blood chemistry: Secondary | ICD-10-CM

## 2021-12-09 MED ORDER — VITAMIN D (ERGOCALCIFEROL) 1.25 MG (50000 UNIT) PO CAPS: 50000.0000 [IU] | ORAL_CAPSULE | ORAL | 0 refills | Status: DC

## 2021-12-09 NOTE — Progress Notes (Signed)
A1C added.   KP 

## 2021-12-12 LAB — HGB A1C W/O EAG: Hgb A1c MFr Bld: 5 % (ref 4.8–5.6)

## 2021-12-12 LAB — SPECIMEN STATUS REPORT

## 2021-12-21 ENCOUNTER — Ambulatory Visit: Payer: BC Managed Care – PPO | Admitting: Anesthesiology

## 2021-12-21 ENCOUNTER — Encounter
Admission: RE | Disposition: A | Discharge status: Home / Self Care | Payer: Self-pay | Source: Home / Self Care | Attending: Gastroenterology

## 2021-12-21 ENCOUNTER — Ambulatory Visit
Admission: RE | Admit: 2021-12-21 | Discharge: 2021-12-21 | Disposition: A | Discharge status: Home / Self Care | Payer: BC Managed Care – PPO | Attending: Gastroenterology | Admitting: Gastroenterology

## 2021-12-21 ENCOUNTER — Encounter: Payer: Self-pay | Admitting: Gastroenterology

## 2021-12-21 DIAGNOSIS — K219 Gastro-esophageal reflux disease without esophagitis: Secondary | ICD-10-CM | POA: Diagnosis not present

## 2021-12-21 DIAGNOSIS — F1290 Cannabis use, unspecified, uncomplicated: Secondary | ICD-10-CM | POA: Insufficient documentation

## 2021-12-21 DIAGNOSIS — Z1211 Encounter for screening for malignant neoplasm of colon: Secondary | ICD-10-CM

## 2021-12-21 HISTORY — PX: COLONOSCOPY WITH PROPOFOL: SHX5780

## 2021-12-21 SURGERY — COLONOSCOPY WITH PROPOFOL
Anesthesia: General

## 2021-12-21 MED ORDER — LIDOCAINE HCL (PF) 1 % IJ SOLN
INTRAMUSCULAR | Status: AC
  Filled 2021-12-21: qty 2

## 2021-12-21 MED ORDER — PROPOFOL 10 MG/ML IV BOLUS
INTRAVENOUS | Status: AC
  Filled 2021-12-21: qty 40

## 2021-12-21 MED ORDER — SIMETHICONE 40 MG/0.6ML PO SUSP
ORAL | Status: DC | PRN
  Administered 2021-12-21: 20 mg via ORAL

## 2021-12-21 MED ORDER — PROPOFOL 10 MG/ML IV BOLUS
INTRAVENOUS | Status: AC
  Filled 2021-12-21: qty 20

## 2021-12-21 MED ORDER — PROPOFOL 500 MG/50ML IV EMUL
INTRAVENOUS | Status: DC | PRN
  Administered 2021-12-21: 175 ug/kg/min via INTRAVENOUS

## 2021-12-21 MED ORDER — PROPOFOL 10 MG/ML IV BOLUS
INTRAVENOUS | Status: DC | PRN
  Administered 2021-12-21: 100 mg via INTRAVENOUS

## 2021-12-21 MED ORDER — LIDOCAINE HCL (CARDIAC) PF 100 MG/5ML IV SOSY
PREFILLED_SYRINGE | INTRAVENOUS | Status: DC | PRN
  Administered 2021-12-21: 20 mg via INTRAVENOUS

## 2021-12-21 MED ORDER — SODIUM CHLORIDE 0.9 % IV SOLN
INTRAVENOUS | Status: DC
  Administered 2021-12-21: 1000 mL via INTRAVENOUS

## 2021-12-21 NOTE — Anesthesia Postprocedure Evaluation (Signed)
Anesthesia Post Note ? ?Patient: Bill Smith ? ?Procedure(s) Performed: COLONOSCOPY WITH PROPOFOL ? ?Patient location during evaluation: Endoscopy ?Anesthesia Type: General ?Level of consciousness: awake and alert ?Pain management: pain level controlled ?Vital Signs Assessment: post-procedure vital signs reviewed and stable ?Respiratory status: spontaneous breathing, nonlabored ventilation, respiratory function stable and patient connected to nasal cannula oxygen ?Cardiovascular status: blood pressure returned to baseline and stable ?Postop Assessment: no apparent nausea or vomiting ?Anesthetic complications: no ? ? ?No notable events documented. ? ? ?Last Vitals:  ?Vitals:  ? 12/21/21 1010 12/21/21 1020  ?BP: 112/64 128/72  ?Pulse: 72   ?Resp: (!) 22 (!) 21  ?Temp:    ?SpO2: 100% 100%  ?  ?Last Pain:  ?Vitals:  ? 12/21/21 0950  ?TempSrc: Temporal  ?PainSc:   ? ? ?  ?  ?  ?  ?  ?  ? ?Bill Smith ? ? ? ? ?

## 2021-12-21 NOTE — Anesthesia Preprocedure Evaluation (Signed)
Anesthesia Evaluation  ?Patient identified by MRN, date of birth, ID band ?Patient awake ? ? ? ?Reviewed: ?Allergy & Precautions, NPO status , Patient's Chart, lab work & pertinent test results ? ?History of Anesthesia Complications ?Negative for: history of anesthetic complications ? ?Airway ?Mallampati: III ? ?TM Distance: >3 FB ?Neck ROM: full ? ? ? Dental ? ?(+) Chipped, Poor Dentition, Missing ?  ?Pulmonary ?neg shortness of breath, sleep apnea ,  ?  ?Pulmonary exam normal ? ? ? ? ? ? ? Cardiovascular ?(-) angina(-) Past MI negative cardio ROS ?Normal cardiovascular exam ? ? ?  ?Neuro/Psych ? Neuromuscular disease negative psych ROS  ? GI/Hepatic ?Neg liver ROS, GERD  Controlled,  ?Endo/Other  ?negative endocrine ROS ? Renal/GU ?negative Renal ROS  ?negative genitourinary ?  ?Musculoskeletal ? ? Abdominal ?  ?Peds ? Hematology ?negative hematology ROS ?(+)   ?Anesthesia Other Findings ?Past Medical History: ?No date: Vitamin D deficiency ? ?History reviewed. No pertinent surgical history. ? ?BMI   ? Body Mass Index: 50.14 kg/m?  ?  ? ? Reproductive/Obstetrics ?negative OB ROS ? ?  ? ? ? ? ? ? ? ? ? ? ? ? ? ?  ?  ? ? ? ? ? ? ? ? ?Anesthesia Physical ?Anesthesia Plan ? ?ASA: 3 ? ?Anesthesia Plan: General  ? ?Post-op Pain Management:   ? ?Induction: Intravenous ? ?PONV Risk Score and Plan: Propofol infusion and TIVA ? ?Airway Management Planned: Natural Airway and Nasal Cannula ? ?Additional Equipment:  ? ?Intra-op Plan:  ? ?Post-operative Plan:  ? ?Informed Consent: I have reviewed the patients History and Physical, chart, labs and discussed the procedure including the risks, benefits and alternatives for the proposed anesthesia with the patient or authorized representative who has indicated his/her understanding and acceptance.  ? ? ? ?Dental Advisory Given ? ?Plan Discussed with: Anesthesiologist, CRNA and Surgeon ? ?Anesthesia Plan Comments: (Patient consented for risks of  anesthesia including but not limited to:  ?- adverse reactions to medications ?- risk of airway placement if required ?- damage to eyes, teeth, lips or other oral mucosa ?- nerve damage due to positioning  ?- sore throat or hoarseness ?- Damage to heart, brain, nerves, lungs, other parts of body or loss of life ? ?Patient voiced understanding.)  ? ? ? ? ? ? ?Anesthesia Quick Evaluation ? ?

## 2021-12-21 NOTE — Transfer of Care (Signed)
Immediate Anesthesia Transfer of Care Note ? ?Patient: Bill Smith ? ?Procedure(s) Performed: COLONOSCOPY WITH PROPOFOL ? ?Patient Location: Endoscopy Unit ? ?Anesthesia Type:General ? ?Level of Consciousness: awake ? ?Airway & Oxygen Therapy: Patient Spontanous Breathing ? ?Post-op Assessment: Report given to RN and Post -op Vital signs reviewed and stable ? ?Post vital signs: Reviewed and stable ? ?Last Vitals:  ?Vitals Value Taken Time  ?BP 117/76 12/21/21 0954  ?Temp    ?Pulse 75 12/21/21 0954  ?Resp 0 12/21/21 0954  ?SpO2 100 % 12/21/21 0954  ?Vitals shown include unvalidated device data. ? ?Last Pain:  ?Vitals:  ? 12/21/21 0850  ?TempSrc: Tympanic  ?PainSc: 0-No pain  ?   ? ?  ? ?Complications: No notable events documented. ?

## 2021-12-21 NOTE — Op Note (Signed)
[facility] ?Gastroenterology ?Patient Name: Finneas Stengel ?Procedure Date: 12/21/2021 9:22 AM ?MRN: [id number] ?Account #: [id number] ?Date of Birth: October 04, 1976 ?Admit Type: Outpatient ?Age: 46 ?Room: [facility] ENDO ROOM 4 ?Gender: Male ?Note Status: Finalized ?Instrument Name: Colonscope [id number] ?Procedure:             Colonoscopy ?Indications:           Screening for colorectal malignant neoplasm, This is  ?                       the patient's first colonoscopy ?Providers:             Lin Landsman MD, MD ?Referring MD:          Forest Gleason Md, MD (Referring MD) ?Medicines:             See the Anesthesia note for documentation of the  ?                       administered medications, General Anesthesia ?Complications:         No immediate complications. Estimated blood loss: None. ?Procedure:             Pre-Anesthesia Assessment: ?                       - Prior to the procedure, a History and Physical was  ?                       performed, and patient medications and allergies were  ?                       reviewed. The patient is competent. The risks and  ?                       benefits of the procedure and the sedation options and  ?                       risks were discussed with the patient. All questions  ?                       were answered and informed consent was obtained.  ?                       Patient identification and proposed procedure were  ?                       verified by the physician, the nurse, the  ?                       anesthesiologist, the anesthetist and the technician  ?                       in the pre-procedure area in the procedure room in the  ?                       endoscopy suite. Mental Status Examination: alert and  ?                       oriented. Airway Examination: normal oropharyngeal  ?  airway and neck mobility. Respiratory Examination:  ?                       clear to auscultation. CV Examination: normal.  ?                        Prophylactic Antibiotics: The patient does not require  ?                       prophylactic antibiotics. Prior Anticoagulants: The  ?                       patient has taken no previous anticoagulant or  ?                       antiplatelet agents. ASA Grade Assessment: III - A  ?                       patient with severe systemic disease. After reviewing  ?                       the risks and benefits, the patient was deemed in  ?                       satisfactory condition to undergo the procedure. The  ?                       anesthesia plan was to use general anesthesia.  ?                       Immediately prior to administration of medications,  ?                       the patient was re-assessed for adequacy to receive  ?                       sedatives. The heart rate, respiratory rate, oxygen  ?                       saturations, blood pressure, adequacy of pulmonary  ?                       ventilation, and response to care were monitored  ?                       throughout the procedure. The physical status of the  ?                       patient was re-assessed after the procedure. ?                       After obtaining informed consent, the colonoscope was  ?                       passed under direct vision. Throughout the procedure,  ?                       the patient's blood pressure, pulse, and oxygen  ?  saturations were monitored continuously. The  ?                       Colonoscope was introduced through the anus and  ?                       advanced to the the cecum, identified by appendiceal  ?                       orifice and ileocecal valve. The colonoscopy was  ?                       performed without difficulty. The patient tolerated  ?                       the procedure well. The quality of the bowel  ?                       preparation was evaluated using the BBPS [facility] Bowel  ?                       Preparation Scale) with scores of: Right Colon = 2  ?                        (minor amount of residual staining, small fragments of  ?                       stool and/or opaque liquid, but mucosa seen well),  ?                       Transverse Colon = 3 (entire mucosa seen well with no  ?                       residual staining, small fragments of stool or opaque  ?                       liquid) and Left Colon = 2 (minor amount of residual  ?                       staining, small fragments of stool and/or opaque  ?                       liquid, but mucosa seen well). The total BBPS score  ?                       equals 7. The quality of the bowel preparation was  ?                       fair. ?Findings: ?     The perianal and digital rectal examinations were normal. Pertinent  ?     negatives include normal sphincter tone and no palpable rectal lesions. ?     A moderate amount of liquid stool was found in the ascending colon and  ?     in the cecum. Lavage of the area was performed using 50 - 200 mL of  ?     sterile water, resulting in clearance with fair visualization. ?  The retroflexed view of the distal rectum and anal verge was normal and  ?     showed no anal or rectal abnormalities. ?     The exam was otherwise without abnormality. ?Impression:            - Preparation of the colon was fair. ?                       - Stool in the ascending colon and in the cecum. ?                       - The distal rectum and anal verge are normal on  ?                       retroflexion view. ?                       - The examination was otherwise normal. ?                       - No specimens collected. ?Recommendation:        - Discharge patient to home (with escort). ?                       - Resume previous diet today. ?                       - Continue present medications. ?                       - Repeat colonoscopy in 3 years with 2 day prep  ?                       because the bowel preparation was suboptimal. ?Procedure Code(s):     --- Professional --- ?                        [id number], Colorectal cancer screening; colonoscopy on  ?                       individual not meeting criteria for high risk ?Diagnosis Code(s):     --- Professional --- ?                       Z12.11, Encounter for screening for malignant neoplasm  ?                       of colon ?CPT copyright 2019 American Medical Association. All rights reserved. ?The codes documented in this report are preliminary and upon coder review may  ?be revised to meet current compliance requirements. ?Dr. Ulyess Mort ?Keeton Kassebaum Raeanne Gathers MD, MD ?12/21/2021 9:50:45 AM ?This report has been signed electronically. ?Number of Addenda: 0 ?Note Initiated On: 12/21/2021 9:22 AM ?Scope Withdrawal Time: 0 hours 9 minutes 58 seconds  ?Total Procedure Duration: 0 hours 12 minutes 38 seconds  ?Estimated Blood Loss:  Estimated blood loss: none. ?     [facility] ?

## 2021-12-21 NOTE — H&P (Signed)
?  Cephas Darby, MD ?[address]  ?[address]  ?[place],  [address]  ?Main: [phone]  ?Fax: [phone] ?Pager: [phone] ? ?Primary Care Physician:  [name], MD ?Primary Gastroenterologist:  Dr. Cephas Darby ? ?Pre-Procedure History & Physical: ?HPI:  Bill Smith is a 46 y.o. male is here for an colonoscopy. ?  ?Past Medical History:  ?Diagnosis Date  ? Vitamin D deficiency   ? ? ?History reviewed. No pertinent surgical history. ? ?Prior to Admission medications   ?Medication Sig Start Date End Date Taking? Authorizing Provider  ?Vitamin D, Ergocalciferol, (DRISDOL) 1.25 MG (50000 UNIT) CAPS capsule Take 1 capsule (50,000 Units total) by mouth every 7 (seven) days. 12/09/21   [name], MD  ? ? ?Allergies as of 12/06/2021 - Review Complete 12/05/2021  ?Allergen Reaction Noted  ? Tessalon [benzonatate] Itching 07/19/2013  ? ? ?Family History  ?Problem Relation Age of Onset  ? Hypertension Mother   ? Hyperlipidemia Mother   ? Drug abuse Father   ? Diabetes Father   ? Hypertension Father   ? Liver cancer Father   ? Mental illness Sister   ? Scleroderma Maternal Grandmother   ? Lung cancer Paternal Grandmother   ? ? ?Social History  ? ?Socioeconomic History  ? Marital status: Married  ?  Spouse name: Haddon Lanzo  ? Number of children: 1  ? Years of education: 54  ? Highest education level: Bachelor's degree (e.g., BA, AB, BS)  ?Occupational History  ? Not on file  ?Tobacco Use  ? Smoking status: Never  ? Smokeless tobacco: Never  ?Vaping Use  ? Vaping Use: Never used  ?Substance and Sexual Activity  ? Alcohol use: Yes  ?  Alcohol/week: 6.0 standard drinks  ?  Types: 6 Standard drinks or equivalent per week  ? Drug use: Yes  ?  Types: Marijuana  ? Sexual activity: Yes  ?  Partners: Female  ?Other Topics Concern  ? Not on file  ?Social History Narrative  ? Not on file  ? ?Social Determinants of Health  ? ?Financial Resource Strain: Not on file  ?Food Insecurity: Not on file   ?Transportation Needs: Not on file  ?Physical Activity: Not on file  ?Stress: Not on file  ?Social Connections: Not on file  ?Intimate Partner Violence: Not on file  ? ? ?Review of Systems: ?See HPI, otherwise negative ROS ? ?Physical Exam: ?BP (!) 140/92   Pulse 80   Temp 97.9 ?F (36.6 ?C) (Tympanic)   Resp 16   Ht 5\' 9"  (1.753 m)   Wt (!) 154 kg   SpO2 100%   BMI 50.14 kg/m?  ?General:   Alert,  pleasant and cooperative in NAD ?Head:  Normocephalic and atraumatic. ?Neck:  Supple; no masses or thyromegaly. ?Lungs:  Clear throughout to auscultation.    ?Heart:  Regular rate and rhythm. ?Abdomen:  Soft, nontender and nondistended. Normal bowel sounds, without guarding, and without rebound.   ?Neurologic:  Alert and  oriented x4;  grossly normal neurologically. ? ?Impression/Plan: ?Terral Glaves is here for an colonoscopy to be performed for colon cancer screening ? ?Risks, benefits, limitations, and alternatives regarding  colonoscopy have been reviewed with the patient.  Questions have been answered.  All parties agreeable. ? ? ?Sherri Sear, MD  12/21/2021, 9:21 AM ?

## 2021-12-21 NOTE — Anesthesia Procedure Notes (Signed)
Anesthesia Procedure Note     

## 2021-12-22 ENCOUNTER — Encounter: Payer: Self-pay | Admitting: Gastroenterology

## 2022-02-07 ENCOUNTER — Other Ambulatory Visit: Payer: Self-pay | Admitting: Family Medicine

## 2022-02-07 DIAGNOSIS — R7989 Other specified abnormal findings of blood chemistry: Secondary | ICD-10-CM

## 2022-02-08 NOTE — Telephone Encounter (Signed)
Requested medication (s) are due for refill today:yes ? ?Requested medication (s) are on the active medication list: yes   ? ?Last refill: 12/09/21  #8  0 refills ? ?Future visit scheduled yes 12/07/22 ? ?Notes to clinic:Not delegated, please review.Thank you ? ?Requested Prescriptions  ?Pending Prescriptions Disp Refills  ? Vitamin D, Ergocalciferol, (DRISDOL) 1.25 MG (50000 UNIT) CAPS capsule [Pharmacy Med Name: VITAMIN D2 50,000IU (ERGO) CAP RX] 8 capsule 0  ?  Sig: TAKE 1 CAPSULE BY MOUTH EVERY 7 DAYS  ?  ? Endocrinology:  Vitamins - Vitamin D Supplementation 2 Failed - 02/07/2022 10:05 AM  ?  ?  Failed - Manual Review: Route requests for 50,000 IU strength to the provider  ?  ?  Failed - Vitamin D in normal range and within 360 days  ?  Vit D, 25-Hydroxy  ?Date Value Ref Range Status  ?12/06/2021 6.3 (L) 30.0 - 100.0 ng/mL Final  ?  Comment:  ?  Vitamin D deficiency has been defined by the Institute of ?Medicine and an Endocrine Society practice guideline as a ?level of serum 25-OH vitamin D less than 20 ng/mL (1,2). ?The Endocrine Society went on to further define vitamin D ?insufficiency as a level between 21 and 29 ng/mL (2). ?1. IOM (Institute of Medicine). 2010. Dietary reference ?   intakes for calcium and D. [place]: The ?   [employer]. ?2. Holick MF, Binkley [place], Bischoff-Ferrari HA, et al. ?   Evaluation, treatment, and prevention of vitamin D ?   deficiency: an Endocrine Society clinical practice ?   guideline. JCEM. 2011 Jul; 96(7):1911-30. ?  ?  ?  ?  ?  Passed - Ca in normal range and within 360 days  ?  Calcium  ?Date Value Ref Range Status  ?12/06/2021 9.4 8.7 - 10.2 mg/dL Final  ?  ?  ?  ?  Passed - Valid encounter within last 12 months  ?  Recent Outpatient Visits   ? ?      ? 2 months ago Annual physical exam  ? [facility] Jerrol Banana, MD  ? 3 months ago Biceps tendinitis, right  ? [facility] Jerrol Banana, MD  ? 4 months ago Biceps tendinitis,  right  ? [facility] Jerrol Banana, MD  ? 3 years ago Encounter for health maintenance examination in adult  ? Primary Care at [facility], Manus Rudd, MD  ? ?  ?  ?Future Appointments   ? ?        ? In 10 months [name] [name], MD [facility]  ? ?  ? ? ?  ?  ?  ? ? ? ? ?

## 2022-04-07 ENCOUNTER — Encounter: Payer: Self-pay | Admitting: Family Medicine

## 2022-04-10 ENCOUNTER — Encounter: Payer: Self-pay | Admitting: Family Medicine

## 2022-04-10 ENCOUNTER — Ambulatory Visit: Payer: BC Managed Care – PPO | Admitting: Family Medicine

## 2022-04-10 VITALS — BP 136/82 | HR 100 | Ht 69.5 in | Wt 351.0 lb

## 2022-04-10 DIAGNOSIS — N529 Male erectile dysfunction, unspecified: Secondary | ICD-10-CM | POA: Diagnosis not present

## 2022-04-10 DIAGNOSIS — G4733 Obstructive sleep apnea (adult) (pediatric): Secondary | ICD-10-CM | POA: Diagnosis not present

## 2022-04-10 DIAGNOSIS — Z9989 Dependence on other enabling machines and devices: Secondary | ICD-10-CM

## 2022-04-10 MED ORDER — TADALAFIL 5 MG PO TABS: 5.0000 mg | ORAL_TABLET | Freq: Every day | ORAL | 0 refills | Status: DC | PRN

## 2022-04-10 MED ORDER — TADALAFIL 5 MG PO TABS: 5.0000 mg | ORAL_TABLET | Freq: Every day | ORAL | 0 refills | Status: AC | PRN

## 2022-04-10 NOTE — Assessment & Plan Note (Signed)
Patient gives history of 2-year progressive the worsening erectile dysfunction described as inability to achieve a full erection that has been worsening, additionally he brings up occasions of anorgasmia.  He links the symptoms as occurring shortly after his vasectomy 2 years prior with a urologic group located in [facility].  He does state that he brought this issue up with them, was offered sildenafil and states that this did not achieve desired effect and was lost to follow-up.  He denies any other urologic symptoms or complaints.  PHQ and GAD scores reviewed and reassuring.  Plan for trial of Cialis, 5-20 mg dosing, early a.m. testosterone levels, referral to urology, restarting compliance with CPAP, and cardiovascular exercise.  He can follow-up on as-needed basis for this issue.

## 2022-04-10 NOTE — Assessment & Plan Note (Signed)
Patient has been noncompliant with the same, described the nature of untreated OSA and additional mask options if this poses a barrier to compliance.  He will contact us if wishing to follow-up with sleep medicine group.

## 2022-05-04 NOTE — Progress Notes (Signed)
05/05/22 12:08 PM   Bill Smith Dec 15, 1975 [id number]  Referring provider:  Jerrol Banana, MD [address] [place],  [place] [address] Chief Complaint  Patient presents with   Erectile Dysfunction     HPI: Bill Smith is a 46 y.o.male who presents today for further evaluation of erectile dysfunction.   He was previously seen with a urologic group in [facility].   He was seen by his PCP, Dr Kenyen Candy Royalty on 04/10/2022. He was noted to have erectile dysfunction that has been ongoing for 2 years with occasional anorgasmia. He was prescribed trial of Cialis.  He reports that is ed symptoms did not stat until after vasectomy. He has not had a spontaneous erection since vasectomy.   He reports libido remains intact .  He denies any other signs or symptoms of low testosterone.  He does have fewer early morning erections.  Testosterone levels were ordered by his PCP but not drawn.  Today, he reports that he has responded nicely to Cialis.  It has provided with adequate erections.  His primary concern is that he like to go back to the way he was before his vasectomy.  He worries that he was "messed up".  SHIM 16 below.    SHIM     Row Name 05/05/22 0916         SHIM: Over the last 6 months:   How do you rate your confidence that you could get and keep an erection? Low     When you had erections with sexual stimulation, how often were your erections hard enough for penetration (entering your partner)? Most Times (much more than half the time)     During sexual intercourse, how often were you able to maintain your erection after you had penetrated (entered) your partner? Sometimes (about half the time)     During sexual intercourse, how difficult was it to maintain your erection to completion of intercourse? Difficult     When you attempted sexual intercourse, how often was it satisfactory for you? Most Times (much more than half the time)       SHIM Total Score    SHIM 16                PMH: Past Medical History:  Diagnosis Date   Vitamin D deficiency     Surgical History: Past Surgical History:  Procedure Laterality Date   COLONOSCOPY WITH PROPOFOL N/A 12/21/2021   Procedure: COLONOSCOPY WITH PROPOFOL;  Surgeon: Toney Reil, MD;  Location: [facility] ENDOSCOPY;  Service: Gastroenterology;  Laterality: N/A;   KNEE ARTHROSCOPY Left    1980s   VASECTOMY  2021   WISDOM TOOTH EXTRACTION      Home Medications:  Allergies as of 05/05/2022       Reactions   Tessalon [benzonatate] Itching        Medication List        Accurate as of May 05, 2022 12:08 PM. If you have any questions, ask your nurse or doctor.          tadalafil 5 MG tablet Commonly known as: Cialis Take 1-4 tablets (5-20 mg total) by mouth daily as needed for erectile dysfunction (>=30 minutes prior to anticipated sexual activity).        Allergies:  Allergies  Allergen Reactions   Tessalon [Benzonatate] Itching    Family History: Family History  Problem Relation Age of Onset   Hypertension Mother    Hyperlipidemia Mother  Drug abuse Father    Diabetes Father    Hypertension Father    Liver cancer Father    Mental illness Sister    Scleroderma Maternal Grandmother    Lung cancer Paternal Grandmother     Social History:  reports that he has never smoked. He has never used smokeless tobacco. He reports current alcohol use of about 6.0 standard drinks of alcohol per week. He reports current drug use. Drug: Marijuana.   Physical Exam: BP (!) 152/89   Pulse 78   Ht 5' 9.5" (1.765 m)   Wt (!) 320 lb (145.2 kg)   BMI 46.58 kg/m   Constitutional:  Alert and oriented, No acute distress. HEENT: Katie AT, moist mucus membranes.  Trachea midline, no masses. Cardiovascular: No clubbing, cyanosis, or edema. Respiratory: Normal respiratory effort, no increased work of breathing. Skin: No rashes, bruises or suspicious lesions. GU: Normal  circumcised phallus.  Bilateral descended testicles which are normal in size, no atrophy, no masses. Neurologic: Grossly intact, no focal deficits, moving all 4 extremities. Psychiatric: Normal mood and affect.  Laboratory Data:  Lab Results  Component Value Date   CREATININE 1.14 12/06/2021   Lab Results  Component Value Date   HGBA1C 5.0 12/06/2021    Assessment & Plan:    Erectile dysfunction  - We discussed the pathophysiology of erectile dysfunction today along with possible contributing factors. Discussed possible treatment options including PDE 5 inhibitors, vacuum erectile device, intracavernosal injection, MUSE, and placement of the inflatable or malleable penile prosthesis for refractory cases.  In terms of PDE 5 inhibitors, we discussed contraindications for this medication as well as common side effects. Patient was counseled on optimal use. All of his questions were answered in detail.  - Discussed that his medical comorbidities including morbid obesity are likely contributing to his current presentation.  Strongly recommended healthy lifestyle with weight loss, appropriate diet, and exercise.. Encourage him to work on weight loss  -He was reassured that exam is completely benign, counseled that vasectomy should not cause erectile dysfunction and the pathophysiology of erectile dysfunction.  - He has improvement on Tadalafil. Continue medication    Tawni Millers as a scribe for Vanna Scotland, MD.,have documented all relevant documentation on the behalf of Vanna Scotland, MD,as directed by  Vanna Scotland, MD while in the presence of Vanna Scotland, MD.  I have reviewed the above documentation for accuracy and completeness, and I agree with the above.   Vanna Scotland, MD    [facility] [address], [address] [place], [place] [address] [phone]

## 2022-05-05 ENCOUNTER — Ambulatory Visit: Payer: BC Managed Care – PPO | Admitting: Urology

## 2022-05-05 ENCOUNTER — Encounter: Payer: Self-pay | Admitting: Urology

## 2022-05-05 VITALS — BP 152/89 | HR 78 | Ht 69.5 in | Wt 320.0 lb

## 2022-05-05 DIAGNOSIS — N5203 Combined arterial insufficiency and corporo-venous occlusive erectile dysfunction: Secondary | ICD-10-CM

## 2022-10-04 ENCOUNTER — Ambulatory Visit: Payer: BC Managed Care – PPO | Admitting: Family Medicine

## 2022-10-04 ENCOUNTER — Encounter: Payer: Self-pay | Admitting: Family Medicine

## 2022-10-04 VITALS — BP 128/78 | HR 89 | Temp 98.9°F | Ht 69.5 in | Wt 366.0 lb

## 2022-10-04 DIAGNOSIS — J069 Acute upper respiratory infection, unspecified: Secondary | ICD-10-CM | POA: Insufficient documentation

## 2022-10-04 DIAGNOSIS — R52 Pain, unspecified: Secondary | ICD-10-CM | POA: Diagnosis not present

## 2022-10-04 LAB — POCT INFLUENZA A/B: Influenza A, POC: NEGATIVE

## 2022-10-04 LAB — POC COVID19 BINAXNOW: SARS Coronavirus 2 Ag: NEGATIVE

## 2022-10-04 MED ORDER — PROMETHAZINE-DM 6.25-15 MG/5ML PO SYRP: 5.0000 mL | ORAL_SOLUTION | Freq: Four times a day (QID) | ORAL | 0 refills | Status: DC | PRN

## 2022-10-04 MED ORDER — METHYLPREDNISOLONE 4 MG PO TBPK: ORAL_TABLET | ORAL | 0 refills | Status: DC

## 2022-10-04 NOTE — Patient Instructions (Addendum)
-   Continue Mucinex twice daily - Take Vitamin D, C, Zinc - Use Rx cough medicine as-needed - Can use steroids if not improving by Thursday night / Friday morning - Contact us if no improvement or worsening after Christmas - Remain out of work until after Christmas - Return for physical March 2024

## 2022-10-04 NOTE — Progress Notes (Signed)
     Primary Care / Sports Medicine Office Visit  Patient Information:  Patient ID: Bill Smith, male DOB: [birth year] Age: 46 y.o. MRN: [id number]   Bill Smith is a pleasant 46 y.o. male presenting with the following:  Chief Complaint  Patient presents with   Generalized Body Aches    Chest congestion, sore throat, chills for 3 days, son started with it, but whole house is sick.     Vitals:   10/04/22 1309  BP: 128/78  Pulse: 89  Temp: 98.9 F (37.2 C)  SpO2: 98%   Vitals:   10/04/22 1309  Weight: (!) 366 lb (166 kg)  Height: 5' 9.5" (1.765 m)   Body mass index is 53.27 kg/m.  No results found.   Independent interpretation of notes and tests performed by another provider:   None  Procedures performed:   None  Pertinent History, Exam, Impression, and Recommendations:   Problem List Items Addressed This Visit       Respiratory   Viral upper respiratory illness - Primary    3 days of progressive symptoms in the setting of 81 year-old son with similar symptoms, since improved. He has had myalgias, mildly productive cough, congestion, dysphagia, chills, sweats. Has been dosing OTC flu medications and Mucinex.  Exam with reassuring vitals including temp and O2 sat, there is equal air entry throughout all lung fields, no wheezes, no rales, no rhonchi, benign cardiac sounds, mildly erythematous turbinates, oropharynx without exudates or swelling, TM and canals benign.  He was checked for influenza, COVID, and were both negative. Concern for viral process affective upper respiratory tract, plan for Rx antitussive, supportive care, and low threshold to initiate the prescribed Medrol Pak if without improvement over the next few days. He is to also contact us at the 7-10 day mark for recalcitrant symptoms, antibiotics to be considered then.      Other Visit Diagnoses     Generalized body aches       Relevant Orders   POC COVID-19   POCT Influenza A/B         Orders & Medications Meds ordered this encounter  Medications   promethazine-dextromethorphan (PROMETHAZINE-DM) 6.25-15 MG/5ML syrup    Sig: Take 5 mLs by mouth 4 (four) times daily as needed for cough.    Dispense:  118 mL    Refill:  0   methylPREDNISolone (MEDROL DOSEPAK) 4 MG TBPK tablet    Sig: Take for full course per package instructions    Dispense:  21 tablet    Refill:  0   Orders Placed This Encounter  Procedures   POC COVID-19   POCT Influenza A/B     Return in about 2 months (around 12/15/2022) for CPE.     Jerrol Banana, MD, [facility]   Primary Care Sports Medicine Primary Care and Sports Medicine at [facility]

## 2022-10-04 NOTE — Assessment & Plan Note (Signed)
3 days of progressive symptoms in the setting of 46 year-old son with similar symptoms, since improved. He has had myalgias, mildly productive cough, congestion, dysphagia, chills, sweats. Has been dosing OTC flu medications and Mucinex.  Exam with reassuring vitals including temp and O2 sat, there is equal air entry throughout all lung fields, no wheezes, no rales, no rhonchi, benign cardiac sounds, mildly erythematous turbinates, oropharynx without exudates or swelling, TM and canals benign.  He was checked for influenza, COVID, and were both negative. Concern for viral process affective upper respiratory tract, plan for Rx antitussive, supportive care, and low threshold to initiate the prescribed Medrol Pak if without improvement over the next few days. He is to also contact us at the 7-10 day mark for recalcitrant symptoms, antibiotics to be considered then.

## 2022-12-05 ENCOUNTER — Encounter: Payer: BC Managed Care – PPO | Admitting: Family Medicine

## 2022-12-07 ENCOUNTER — Encounter: Payer: BC Managed Care – PPO | Admitting: Family Medicine

## 2023-01-17 ENCOUNTER — Ambulatory Visit (INDEPENDENT_AMBULATORY_CARE_PROVIDER_SITE_OTHER): Payer: Managed Care, Other (non HMO) | Admitting: Family Medicine

## 2023-01-17 ENCOUNTER — Encounter: Payer: Self-pay | Admitting: Family Medicine

## 2023-01-17 VITALS — BP 120/82 | HR 78 | Ht 69.5 in | Wt 357.0 lb

## 2023-01-17 DIAGNOSIS — N5203 Combined arterial insufficiency and corporo-venous occlusive erectile dysfunction: Secondary | ICD-10-CM | POA: Diagnosis not present

## 2023-01-17 DIAGNOSIS — G4733 Obstructive sleep apnea (adult) (pediatric): Secondary | ICD-10-CM

## 2023-01-17 DIAGNOSIS — Z0000 Encounter for general adult medical examination without abnormal findings: Secondary | ICD-10-CM

## 2023-01-17 DIAGNOSIS — E559 Vitamin D deficiency, unspecified: Secondary | ICD-10-CM

## 2023-01-17 DIAGNOSIS — Z13220 Encounter for screening for lipoid disorders: Secondary | ICD-10-CM

## 2023-01-17 DIAGNOSIS — R7309 Other abnormal glucose: Secondary | ICD-10-CM

## 2023-01-19 LAB — VITAMIN D 25 HYDROXY (VIT D DEFICIENCY, FRACTURES): Vit D, 25-Hydroxy: 7.7 ng/mL — ABNORMAL LOW (ref 30.0–100.0)

## 2023-01-19 LAB — COMPREHENSIVE METABOLIC PANEL
ALT: 12 IU/L (ref 0–44)
AST: 13 IU/L (ref 0–40)
Albumin/Globulin Ratio: 1.4 (ref 1.2–2.2)
Albumin: 4.1 g/dL (ref 4.1–5.1)
Alkaline Phosphatase: 60 IU/L (ref 44–121)
BUN/Creatinine Ratio: 13 (ref 9–20)
BUN: 12 mg/dL (ref 6–24)
Bilirubin Total: 0.3 mg/dL (ref 0.0–1.2)
CO2: 21 mmol/L (ref 20–29)
Calcium: 9.4 mg/dL (ref 8.7–10.2)
Chloride: 107 mmol/L — ABNORMAL HIGH (ref 96–106)
Creatinine, Ser: 0.96 mg/dL (ref 0.76–1.27)
Globulin, Total: 3 g/dL (ref 1.5–4.5)
Glucose: 102 mg/dL — ABNORMAL HIGH (ref 70–99)
Potassium: 5 mmol/L (ref 3.5–5.2)
Sodium: 146 mmol/L — ABNORMAL HIGH (ref 134–144)
Total Protein: 7.1 g/dL (ref 6.0–8.5)
eGFR: 98 mL/min/{1.73_m2} (ref >59)

## 2023-01-19 LAB — LIPID PANEL
Chol/HDL Ratio: 3.6 ratio (ref 0.0–5.0)
Cholesterol, Total: 215 mg/dL — ABNORMAL HIGH (ref 100–199)
HDL: 59 mg/dL (ref >39)
LDL Chol Calc (NIH): 144 mg/dL — ABNORMAL HIGH (ref 0–99)
Triglycerides: 70 mg/dL (ref 0–149)
VLDL Cholesterol Cal: 12 mg/dL (ref 5–40)

## 2023-01-19 LAB — TSH: TSH: 1.58 u[IU]/mL (ref 0.450–4.500)

## 2023-01-19 LAB — HEMOGLOBIN A1C
Est. average glucose Bld gHb Est-mCnc: 108 mg/dL
Hgb A1c MFr Bld: 5.4 % (ref 4.8–5.6)

## 2023-01-19 LAB — CBC
Hematocrit: 43.5 % (ref 37.5–51.0)
Hemoglobin: 14.2 g/dL (ref 13.0–17.7)
MCH: 30.1 pg (ref 26.6–33.0)
MCHC: 32.6 g/dL (ref 31.5–35.7)
MCV: 92 fL (ref 79–97)
Platelets: 372 10*3/uL (ref 150–450)
RBC: 4.72 x10E6/uL (ref 4.14–5.80)
RDW: 11.3 % — ABNORMAL LOW (ref 11.6–15.4)
WBC: 6.2 10*3/uL (ref 3.4–10.8)

## 2023-01-19 LAB — TESTOSTERONE,FREE AND TOTAL
Testosterone, Free: 6.6 pg/mL — ABNORMAL LOW (ref 6.8–21.5)
Testosterone: 460 ng/dL (ref 264–916)

## 2023-01-21 NOTE — Patient Instructions (Signed)
-   Obtain fasting labs with orders provided (can have water or black coffee but otherwise no food or drink x 8 hours before labs) - Review information provided - Attend eye doctor annually, dentist every 6 months, work towards or maintain 30 minutes of moderate intensity physical activity at least 5 days per week, and consume a balanced diet - Return in July - Contact us for any questions between now and then

## 2023-01-21 NOTE — Progress Notes (Signed)
Annual Physical Exam Visit  Patient Information:  Patient ID: Bill Smith, male DOB: [birth year] Age: 47 y.o. MRN: [id number]   Subjective:   CC: Annual Physical Exam  HPI:  Bill Smith is here for their annual physical.  I reviewed the past medical history, family history, social history, surgical history, and allergies today and changes were made as necessary.  Please see the problem list section below for additional details.  Past Medical History: Past Medical History:  Diagnosis Date   Vitamin D deficiency    Past Surgical History: Past Surgical History:  Procedure Laterality Date   COLONOSCOPY WITH PROPOFOL N/A 12/21/2021   Procedure: COLONOSCOPY WITH PROPOFOL;  Surgeon: Toney Reil, MD;  Location: [facility] ENDOSCOPY;  Service: Gastroenterology;  Laterality: N/A;   KNEE ARTHROSCOPY Left    1980s   VASECTOMY  2021   WISDOM TOOTH EXTRACTION     Family History: Family History  Problem Relation Age of Onset   Hypertension Mother    Hyperlipidemia Mother    Drug abuse Father    Diabetes Father    Hypertension Father    Liver cancer Father    Mental illness Sister    Scleroderma Maternal Grandmother    Lung cancer Paternal Grandmother    Allergies: Allergies  Allergen Reactions   Tessalon [Benzonatate] Itching   Health Maintenance: Health Maintenance  Topic Date Due   COVID-19 Vaccine (1) 10/20/2025 (Originally 03/07/1977)   INFLUENZA VACCINE  05/17/2023   COLONOSCOPY (Pts 45-98yrs Insurance coverage will need to be confirmed)  12/21/2024   DTaP/Tdap/Td (2 - Td or Tdap) 12/06/2031   Hepatitis C Screening  Completed   HIV Screening  Completed   HPV VACCINES  Aged Out    HM Colonoscopy          COLONOSCOPY (Pts 45-25yrs Insurance coverage will need to be confirmed) (Every 3 Years) Next due on 12/21/2024    12/21/2021  COLONOSCOPY   Only the first 1 history entries have been loaded, but more history exists.           Medications: Current  Outpatient Medications on File Prior to Visit  Medication Sig Dispense Refill   tadalafil (CIALIS) 5 MG tablet Take 1-4 tablets (5-20 mg total) by mouth daily as needed for erectile dysfunction (>=30 minutes prior to anticipated sexual activity). 90 tablet 0   No current facility-administered medications on file prior to visit.    Review of Systems: No headache, visual changes, nausea, vomiting, diarrhea, constipation, dizziness, abdominal pain, skin rash, fevers, chills, night sweats, swollen lymph nodes, weight loss, chest pain, body aches, joint swelling, muscle aches, shortness of breath, mood changes, visual or auditory hallucinations reported.  Objective:   Vitals:   01/17/23 0832  BP: 120/82  Pulse: 78  SpO2: 96%   Vitals:   01/17/23 0832  Weight: (!) 357 lb (161.9 kg)  Height: 5' 9.5" (1.765 m)   Body mass index is 51.96 kg/m.  General: Well Developed, well nourished, and in no acute distress.  Neuro: Alert and oriented x3, extra-ocular muscles intact, sensation grossly intact. Cranial nerves II through XII are grossly intact, motor, sensory, and coordinative functions are intact. HEENT: Normocephalic, atraumatic, pupils equal round reactive to light, neck supple, no masses, no lymphadenopathy, thyroid nonpalpable. Oropharynx, nasopharynx, external ear canals are unremarkable. Skin: Warm and dry, no rashes noted.  Cardiac: Regular rate and rhythm, no murmurs rubs or gallops. No peripheral edema. Pulses symmetric. Respiratory: Clear to auscultation bilaterally. Not using accessory  muscles, speaking in full sentences.  Abdominal: Soft, nontender, nondistended, positive bowel sounds, no masses, no organomegaly. Musculoskeletal: Shoulder, elbow, wrist, hip, knee, ankle stable, and with full range of motion.   Impression and Recommendations:   The patient was counselled, risk factors were discussed, and anticipatory guidance given.  Problem List Items Addressed This Visit        Respiratory   OSA on CPAP    Compliance issues discussed, related to mask. He is amenable to referral to pulmonary sleep medicine, referral placed.      Relevant Orders   Ambulatory referral to Pulmonology     Other   Annual physical exam - Primary    Annual examination completed, risk stratification labs ordered, anticipatory guidance provided.  We will follow labs once resulted.      Relevant Orders   CBC (Completed)   Comprehensive metabolic panel (Completed)   Lipid panel (Completed)   TSH (Completed)   VITAMIN D 25 Hydroxy (Vit-D Deficiency, Fractures) (Completed)   Testosterone,Free and Total (Completed)   Hemoglobin A1c (Completed)   Elevated glucose   Relevant Orders   Comprehensive metabolic panel (Completed)   Hemoglobin A1c (Completed)   Erectile dysfunction   Relevant Orders   CBC (Completed)   Lipid panel (Completed)   Testosterone,Free and Total (Completed)   Other Visit Diagnoses     Screening for lipoid disorders       Relevant Orders   Comprehensive metabolic panel (Completed)   Lipid panel (Completed)   Healthcare maintenance       Relevant Orders   CBC (Completed)   Comprehensive metabolic panel (Completed)   Lipid panel (Completed)   TSH (Completed)   VITAMIN D 25 Hydroxy (Vit-D Deficiency, Fractures) (Completed)   Testosterone,Free and Total (Completed)   Hemoglobin A1c (Completed)   Vitamin D deficiency       Relevant Orders   VITAMIN D 25 Hydroxy (Vit-D Deficiency, Fractures) (Completed)        Orders & Medications Medications: No orders of the defined types were placed in this encounter.  Orders Placed This Encounter  Procedures   CBC   Comprehensive metabolic panel   Lipid panel   TSH   VITAMIN D 25 Hydroxy (Vit-D Deficiency, Fractures)   Testosterone,Free and Total   Hemoglobin A1c   Ambulatory referral to Pulmonology     Return in about 3 months (around 04/18/2023) for f/u OSA.    Jerrol Banana, MD, [facility]    Primary Care Sports Medicine Primary Care and Sports Medicine at [facility]

## 2023-01-21 NOTE — Assessment & Plan Note (Addendum)
Compliance issues discussed, related to mask. He is amenable to referral to pulmonary sleep medicine, referral placed.

## 2023-01-21 NOTE — Assessment & Plan Note (Signed)
Annual examination completed, risk stratification labs ordered, anticipatory guidance provided.  We will follow labs once resulted. 

## 2023-02-05 ENCOUNTER — Other Ambulatory Visit: Payer: Self-pay | Admitting: Family Medicine

## 2023-02-05 DIAGNOSIS — E559 Vitamin D deficiency, unspecified: Secondary | ICD-10-CM

## 2023-02-05 MED ORDER — VITAMIN D (ERGOCALCIFEROL) 1.25 MG (50000 UNIT) PO CAPS: 50000.0000 [IU] | ORAL_CAPSULE | ORAL | 0 refills | Status: DC

## 2023-02-09 ENCOUNTER — Institutional Professional Consult (permissible substitution): Payer: BC Managed Care – PPO | Admitting: Adult Health

## 2023-03-09 ENCOUNTER — Ambulatory Visit (INDEPENDENT_AMBULATORY_CARE_PROVIDER_SITE_OTHER): Payer: Managed Care, Other (non HMO) | Admitting: Nurse Practitioner

## 2023-03-09 ENCOUNTER — Encounter: Payer: Self-pay | Admitting: Nurse Practitioner

## 2023-03-09 VITALS — BP 130/72 | HR 71 | Temp 97.9°F | Ht 69.5 in | Wt 375.2 lb

## 2023-03-09 DIAGNOSIS — R0683 Snoring: Secondary | ICD-10-CM

## 2023-03-09 DIAGNOSIS — Z6841 Body Mass Index (BMI) 40.0 and over, adult: Secondary | ICD-10-CM

## 2023-03-09 DIAGNOSIS — G4733 Obstructive sleep apnea (adult) (pediatric): Secondary | ICD-10-CM | POA: Diagnosis not present

## 2023-03-09 DIAGNOSIS — G4719 Other hypersomnia: Secondary | ICD-10-CM | POA: Diagnosis not present

## 2023-03-09 NOTE — Progress Notes (Signed)
Reviewed and agree with assessment/plan.   Coralyn Helling, MD [facility] Pulmonary/Critical Care 03/09/2023, 11:59 AM Pager:  [phone]

## 2023-03-09 NOTE — Patient Instructions (Signed)
Given your symptoms, I am concerned that you may have sleep disordered breathing with sleep apnea. You will need a sleep study for further evaluation. Someone will contact you to schedule this.   We discussed how untreated sleep apnea puts an individual at risk for cardiac arrhthymias, pulm HTN, DM, stroke and increases their risk for daytime accidents. We also briefly reviewed treatment options including weight loss, side sleeping position, oral appliance, CPAP therapy or referral to ENT for possible surgical options  Use caution when driving and pull over if you become sleepy.  Follow up in 6-8 weeks with Katie San Rua,NP to go over sleep study results, or sooner, if needed. Video visit ok if needed

## 2023-03-09 NOTE — Assessment & Plan Note (Signed)
He has sleep apnea with very old CPAP machine at home. Inconsistent use due to mask fit and no download available. We also do not have access to his previous sleep study. He will need a repeat in lab split night study with CPAP titration. Orders placed today. He was agreeable to this plan. Reviewed risks of untreated OSA. We will place orders for new CPAP machine, if appropriate, once sleep study results obtained. Cautioned on safe driving practices.  He has snoring, excessive daytime sleepiness, nocturnal apneic events. BMI 54. History of OSA. Suspicion for persistent OSA remains high.   - discussed how weight can impact sleep and risk for sleep disordered breathing - discussed options to assist with weight loss: combination of diet modification, cardiovascular and strength training exercises   - had an extensive discussion regarding the adverse health consequences related to untreated sleep disordered breathing - specifically discussed the risks for hypertension, coronary artery disease, cardiac dysrhythmias, cerebrovascular disease, and diabetes - lifestyle modification discussed   - discussed how sleep disruption can increase risk of accidents, particularly when driving - safe driving practices were discussed  Patient Instructions  Given your symptoms, I am concerned that you may have sleep disordered breathing with sleep apnea. You will need a sleep study for further evaluation. Someone will contact you to schedule this.   We discussed how untreated sleep apnea puts an individual at risk for cardiac arrhthymias, pulm HTN, DM, stroke and increases their risk for daytime accidents. We also briefly reviewed treatment options including weight loss, side sleeping position, oral appliance, CPAP therapy or referral to ENT for possible surgical options  Use caution when driving and pull over if you become sleepy.  Follow up in 6-8 weeks with Katie Nani Ingram,NP to go over sleep study results, or sooner,  if needed. Video visit ok if needed

## 2023-03-09 NOTE — Assessment & Plan Note (Signed)
BMI 54.6. Healthy weight loss encouraged.

## 2023-03-09 NOTE — Progress Notes (Signed)
@Patient  ID: Bill Smith, male    DOB: November 18, 1975, 47 y.o.   MRN: [id number]  Chief Complaint  Patient presents with   sleep consult    Sleep study >10y. Does not wear cpap nightly- can not tolerate mask    Referring provider: Jerrol Banana, MD  HPI: 47 year old male, never smoker referred for sleep consult. Past medical history significant for OSA on CPAP, GERD, ED.   TEST/EVENTS:   03/09/2023: Today - sleep consult Patient presents today for sleep consult. He was diagnosed with CPAP around 10 years ago. He has been on CPAP therapy since. He has his original machine. He has also not changed out his mask or headgear in years. He's having trouble with the mask fitting now (nasal mask). Hasn't been wearing his CPAP on a consistent basis because of this. He is tired without CPAP. He has a lot of issues with dry mouth. He also has been told he has  loud snoring and videos of him stopping breathing. He has had sleep paralysis in the past when he hasn't worn his CPAP. Has not occurred in quite some time. He denies any morning headaches, drowsy driving, sleep parasomnias.  He goes to bed around 11 pm. Falls asleep within 30 minutes to an hour. Wakes once or twice a night. Gets up around 6 am. No sleep aids. 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 Last sleep study was in [place]. He does not have these results. No download on machine available. Unsure of settings.  No history of cardiac disease, stroke or DM. He is a never smoker. Drinks a few beers a week. Has 1 cup of coffee most mornings. Lives alone. No significant family history.   Epworth 3  Allergies  Allergen Reactions   Tessalon [Benzonatate] Itching    Immunization History  Administered Date(s) Administered   Tdap 12/05/2021    Past Medical History:  Diagnosis Date   Vitamin D deficiency     Tobacco History: Social History   Tobacco Use  Smoking Status Never  Smokeless Tobacco  Never   Counseling given: Not Answered   Outpatient Medications Prior to Visit  Medication Sig Dispense Refill   tadalafil (CIALIS) 5 MG tablet Take 1-4 tablets (5-20 mg total) by mouth daily as needed for erectile dysfunction (>=30 minutes prior to anticipated sexual activity). 90 tablet 0   Vitamin D, Ergocalciferol, (DRISDOL) 1.25 MG (50000 UNIT) CAPS capsule Take 1 capsule (50,000 Units total) by mouth every 7 (seven) days. Take for 8 total doses(weeks) 8 capsule 0   No facility-administered medications prior to visit.     Review of Systems:   Constitutional: No night sweats, fevers, chills, or lassitude. +weight gain, fatigue  HEENT: No headaches, difficulty swallowing, tooth/dental problems, or sore throat. No sneezing, itching, ear ache, nasal congestion, or post nasal drip. +dry mouth CV:  No chest pain, orthopnea, PND, swelling in lower extremities, anasarca, dizziness, palpitations, syncope Resp: +snoring, witnessed apneas. No shortness of breath with exertion or at rest. No excess mucus or change in color of mucus. No productive or non-productive. No hemoptysis. No wheezing.  No chest wall deformity GI:  No heartburn, indigestion, abdominal pain, nausea, vomiting, diarrhea, change in bowel habits, loss of appetite, bloody stools.  GU: No dysuria, change in color of urine, urgency or frequency, nocturia Skin: No rash, lesions, ulcerations MSK:  No joint pain or swelling.  Neuro: No dizziness or lightheadedness.  Psych:  No depression or anxiety. Mood stable.     Physical Exam:  BP 130/72 (BP Location: Left Wrist, Cuff Size: Normal)   Pulse 71   Temp 97.9 F (36.6 C) (Temporal)   Ht 5' 9.5" (1.765 m)   Wt (!) 375 lb 3.2 oz (170.2 kg)   SpO2 100%   BMI 54.61 kg/m   GEN: Pleasant, interactive, well-kempt; morbidly obese; in no acute distress. HEENT:  Normocephalic and atraumatic. PERRLA. Sclera white. Nasal turbinates pink, moist and patent bilaterally. No rhinorrhea  present. Oropharynx pink and moist, without exudate or edema. No lesions, ulcerations, or postnasal drip. Mallampati III NECK:  Supple w/ fair ROM. No JVD present. Normal carotid impulses w/o bruits. Thyroid symmetrical with no goiter or nodules palpated. No lymphadenopathy.   CV: RRR, no m/r/g, no peripheral edema. Pulses intact, +2 bilaterally. No cyanosis, pallor or clubbing. PULMONARY:  Unlabored, regular breathing. Clear bilaterally A&P w/o wheezes/rales/rhonchi. No accessory muscle use.  GI: BS present and normoactive. Soft, non-tender to palpation. No organomegaly or masses detected.  MSK: No erythema, warmth or tenderness. Cap refil <2 sec all extrem. No deformities or joint swelling noted.  Neuro: A/Ox3. No focal deficits noted.   Skin: Warm, no lesions or rashe Psych: Normal affect and behavior. Judgement and thought content appropriate.     Lab Results:  CBC    Component Value Date/Time   WBC 6.2 01/17/2023 0957   RBC 4.72 01/17/2023 0957   HGB 14.2 01/17/2023 0957   HCT 43.5 01/17/2023 0957   PLT 372 01/17/2023 0957   MCV 92 01/17/2023 0957   MCH 30.1 01/17/2023 0957   MCHC 32.6 01/17/2023 0957   RDW 11.3 (L) 01/17/2023 0957   LYMPHSABS 1.7 12/06/2021 0838   EOSABS 0.1 12/06/2021 0838   BASOSABS 0.0 12/06/2021 0838    BMET    Component Value Date/Time   NA 146 (H) 01/17/2023 0957   K 5.0 01/17/2023 0957   CL 107 (H) 01/17/2023 0957   CO2 21 01/17/2023 0957   GLUCOSE 102 (H) 01/17/2023 0957   BUN 12 01/17/2023 0957   CREATININE 0.96 01/17/2023 0957   CALCIUM 9.4 01/17/2023 0957   GFRNONAA 83 06/03/2018 1709   GFRAA 96 06/03/2018 1709    BNP No results found for: "BNP"   Imaging:  No results found.        No data to display          No results found for: "NITRICOXIDE"      Assessment & Plan:   OSA on CPAP He has sleep apnea with very old CPAP machine at home. Inconsistent use due to mask fit and no download available. We also do not  have access to his previous sleep study. He will need a repeat in lab split night study with CPAP titration. Orders placed today. He was agreeable to this plan. Reviewed risks of untreated OSA. We will place orders for new CPAP machine, if appropriate, once sleep study results obtained. Cautioned on safe driving practices.  He has snoring, excessive daytime sleepiness, nocturnal apneic events. BMI 54. History of OSA. Suspicion for persistent OSA remains high.   - discussed how weight can impact sleep and risk for sleep disordered breathing - discussed options to assist with weight loss: combination of diet modification, cardiovascular and strength training exercises   - had an extensive discussion regarding the adverse health consequences related to untreated sleep disordered breathing - specifically discussed the risks for hypertension, coronary artery disease, cardiac dysrhythmias, cerebrovascular  disease, and diabetes - lifestyle modification discussed   - discussed how sleep disruption can increase risk of accidents, particularly when driving - safe driving practices were discussed  Patient Instructions  Given your symptoms, I am concerned that you may have sleep disordered breathing with sleep apnea. You will need a sleep study for further evaluation. Someone will contact you to schedule this.   We discussed how untreated sleep apnea puts an individual at risk for cardiac arrhthymias, pulm HTN, DM, stroke and increases their risk for daytime accidents. We also briefly reviewed treatment options including weight loss, side sleeping position, oral appliance, CPAP therapy or referral to ENT for possible surgical options  Use caution when driving and pull over if you become sleepy.  Follow up in 6-8 weeks with Katie Shenita Trego,NP to go over sleep study results, or sooner, if needed. 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 Healthy weight loss encouraged.   I spent 35 minutes of dedicated to the care of this patient on the date of this encounter to include pre-visit review of records, face-to-face time with the patient discussing conditions above, post visit ordering of testing, clinical documentation with the electronic health record, making appropriate referrals as documented, and communicating necessary findings to members of the patients care team.  Noemi Chapel, NP 03/09/2023  Pt aware and understands NP's role.

## 2023-04-10 ENCOUNTER — Encounter: Payer: Self-pay | Admitting: Family Medicine

## 2023-04-10 ENCOUNTER — Ambulatory Visit: Payer: Managed Care, Other (non HMO) | Admitting: Family Medicine

## 2023-04-10 VITALS — BP 120/80 | HR 84 | Ht 69.5 in | Wt 379.0 lb

## 2023-04-10 DIAGNOSIS — J00 Acute nasopharyngitis [common cold]: Secondary | ICD-10-CM | POA: Diagnosis not present

## 2023-04-10 DIAGNOSIS — Z6841 Body Mass Index (BMI) 40.0 and over, adult: Secondary | ICD-10-CM

## 2023-04-10 DIAGNOSIS — E662 Morbid (severe) obesity with alveolar hypoventilation: Secondary | ICD-10-CM

## 2023-04-10 DIAGNOSIS — H6693 Otitis media, unspecified, bilateral: Secondary | ICD-10-CM | POA: Insufficient documentation

## 2023-04-10 MED ORDER — PROMETHAZINE-DM 6.25-15 MG/5ML PO SYRP: 5.0000 mL | ORAL_SOLUTION | Freq: Four times a day (QID) | ORAL | 0 refills | Status: DC | PRN

## 2023-04-10 MED ORDER — AZITHROMYCIN 250 MG PO TABS: ORAL_TABLET | ORAL | 0 refills | Status: AC

## 2023-04-10 NOTE — Assessment & Plan Note (Signed)
Chronic, interested in pharmacologic treatment options, has trialed phentermine in the past with limited response.  The patient plans to use the prescribed medication in conjunction with comprehensive lifestyle interventions, including diet and exercise.   -Will contact his insurance company to seek coverage options for [facility] and/or Zepbound, can contact us if covered for Rx - Referral to medical weight management has been placed as well for further optimization

## 2023-04-10 NOTE — Patient Instructions (Addendum)
-   Take antibiotics for full course - Use Rx cough medicine as needed - Start Mucinex twice daily - Maintain follow-up as scheduled  Additionally, ask your insurance about coverage for weight loss medications: - Wegovy (semaglutide) - Zepbound (tirzepatide)

## 2023-04-10 NOTE — Assessment & Plan Note (Signed)
Roughly 10-day history of progressive chest congestion, rhinorrhea, cough productive of greenish phlegm, at times scant hemorrhagic component, no overt shortness of air, no throat pain, no significant facial pressure.  Has been using OTC cough suppressants with minimal response.  Exam demonstrates equal and clear air entry bilaterally without wheezes, rales, rhonchi, tympanic membranes on the right mildly erythematous, dark cerumen bilaterally, nasal turbinates erythematous and mildly swollen, nontender about the sinuses, oropharynx relatively benign.  Given duration of symptoms and findings today, rhinitis of concern, plan as follows:  - Start azithromycin course - As needed Phenergan DM - Mucinex twice daily - Follow-up as-needed

## 2023-04-10 NOTE — Progress Notes (Signed)
     Primary Care / Sports Medicine Office Visit  Patient Information:  Patient ID: Bill Smith, male DOB: 1975/12/04 Age: 47 y.o. MRN: [id number]   Bill Smith is a pleasant 47 y.o. male presenting with the following:  Chief Complaint  Patient presents with   Sinusitis    Cough, phlegm light green, dark green with some blood for 10 days    Vitals:   04/10/23 1333  BP: 120/80  Pulse: 84  SpO2: 98%   Vitals:   04/10/23 1333  Weight: (!) 379 lb (171.9 kg)  Height: 5' 9.5" (1.765 m)   Body mass index is 55.17 kg/m.  No results found.   Independent interpretation of notes and tests performed by another provider:   None  Procedures performed:   None  Pertinent History, Exam, Impression, and Recommendations:   Bill Smith was seen today for sinusitis.  Acute rhinitis Assessment & Plan: Roughly 10-day history of progressive chest congestion, rhinorrhea, cough productive of greenish phlegm, at times scant hemorrhagic component, no overt shortness of air, no throat pain, no significant facial pressure.  Has been using OTC cough suppressants with minimal response.  Exam demonstrates equal and clear air entry bilaterally without wheezes, rales, rhonchi, tympanic membranes on the right mildly erythematous, dark cerumen bilaterally, nasal turbinates erythematous and mildly swollen, nontender about the sinuses, oropharynx relatively benign.  Given duration of symptoms and findings today, rhinitis of concern, plan as follows:  - Start azithromycin course - As needed Phenergan DM - Mucinex twice daily - Follow-up as-needed  Orders: -     Azithromycin; Take 2 tablets on day 1, then 1 tablet daily on days 2 through 5  Dispense: 6 tablet; Refill: 0 -     Promethazine-DM; Take 5 mLs by mouth 4 (four) times daily as needed for cough.  Dispense: 118 mL; Refill: 0  Class 3 obesity with alveolar hypoventilation, serious comorbidity, and body mass index (BMI) of 50.0 to 59.9 in adult  [facility]) Assessment & Plan: Chronic, interested in pharmacologic treatment options, has trialed phentermine in the past with limited response.  The patient plans to use the prescribed medication in conjunction with comprehensive lifestyle interventions, including diet and exercise.   -Will contact his insurance company to seek coverage options for [facility] and/or Zepbound, can contact us if covered for Rx - Referral to medical weight management has been placed as well for further optimization  Orders: -     Amb Ref to Medical Weight Management     Orders & Medications Meds ordered this encounter  Medications   azithromycin (ZITHROMAX) 250 MG tablet    Sig: Take 2 tablets on day 1, then 1 tablet daily on days 2 through 5    Dispense:  6 tablet    Refill:  0   promethazine-dextromethorphan (PROMETHAZINE-DM) 6.25-15 MG/5ML syrup    Sig: Take 5 mLs by mouth 4 (four) times daily as needed for cough.    Dispense:  118 mL    Refill:  0   Orders Placed This Encounter  Procedures   Amb Ref to Medical Weight Management     No follow-ups on file.     Jerrol Banana, MD, [facility]   Primary Care Sports Medicine Primary Care and Sports Medicine at [facility]

## 2023-04-10 NOTE — Assessment & Plan Note (Signed)
>>  ASSESSMENT AND PLAN FOR ACUTE RHINITIS WRITTEN ON 04/10/2023  2:08 PM BY Yarithza Mink, [name], MD  Roughly 10-day history of progressive chest congestion, rhinorrhea, cough productive of greenish phlegm, at times scant hemorrhagic component, no overt shortness of air, no throat pain, no significant facial pressure.  Has been using OTC cough suppressants with minimal response.  Exam demonstrates equal and clear air entry bilaterally without wheezes, rales, rhonchi, tympanic membranes on the right mildly erythematous, dark cerumen bilaterally, nasal turbinates erythematous and mildly swollen, nontender about the sinuses, oropharynx relatively benign.  Given duration of symptoms and findings today, rhinitis of concern, plan as follows:  - Start azithromycin course - As needed Phenergan DM - Mucinex twice daily - Follow-up as-needed

## 2023-04-11 ENCOUNTER — Ambulatory Visit: Payer: Managed Care, Other (non HMO) | Attending: Otolaryngology

## 2023-04-11 DIAGNOSIS — R0683 Snoring: Secondary | ICD-10-CM | POA: Insufficient documentation

## 2023-04-11 DIAGNOSIS — G4733 Obstructive sleep apnea (adult) (pediatric): Secondary | ICD-10-CM | POA: Insufficient documentation

## 2023-04-11 DIAGNOSIS — R400 Somnolence: Secondary | ICD-10-CM | POA: Diagnosis not present

## 2023-04-18 ENCOUNTER — Ambulatory Visit: Payer: Managed Care, Other (non HMO) | Admitting: Family Medicine

## 2023-04-18 ENCOUNTER — Encounter: Payer: Self-pay | Admitting: Family Medicine

## 2023-04-18 VITALS — BP 122/80 | HR 80 | Ht 69.0 in | Wt 380.0 lb

## 2023-04-18 DIAGNOSIS — H6693 Otitis media, unspecified, bilateral: Secondary | ICD-10-CM

## 2023-04-18 DIAGNOSIS — H6123 Impacted cerumen, bilateral: Secondary | ICD-10-CM

## 2023-04-18 DIAGNOSIS — Z6841 Body Mass Index (BMI) 40.0 and over, adult: Secondary | ICD-10-CM | POA: Diagnosis not present

## 2023-04-18 DIAGNOSIS — E662 Morbid (severe) obesity with alveolar hypoventilation: Secondary | ICD-10-CM

## 2023-04-18 MED ORDER — CIPROFLOXACIN-DEXAMETHASONE 0.3-0.1 % OT SUSP: 4.0000 [drp] | Freq: Two times a day (BID) | OTIC | 0 refills | Status: AC

## 2023-04-18 NOTE — Assessment & Plan Note (Signed)
Patient checked with his insurance and Wegovy/Zepbound are not covered.  A referral to weight management was placed at last visit, patient has not heard from them.  Contact information provided to patient via MyChart for him to coordinate scheduling.  Will follow peripherally.

## 2023-04-18 NOTE — Patient Instructions (Addendum)
-   Use Rx eardrops x 7 days - Start Flonase, 2 sprays in each nostril daily x 7 days - Use Mucinex 12-hour formulation twice daily x 7 days - Maintain follow-up with sleep medicine group - Contact number below for medical weight management scheduling:  [place] [facility] at [facility] [phone]

## 2023-04-18 NOTE — Progress Notes (Signed)
Primary Care / Sports Medicine Office Visit  Patient Information:  Patient ID: Bill Smith, male DOB: 1976-07-03 Age: 47 y.o. MRN: [id number]   Bill Smith is a pleasant 47 y.o. male presenting with the following:  Chief Complaint  Patient presents with   Ear Fullness    Both for couple weeks    Vitals:   04/18/23 0919  BP: 122/80  Pulse: 80  SpO2: 98%   Vitals:   04/18/23 0919  Weight: (!) 380 lb (172.4 kg)  Height: 5\' 9"  (1.753 m)   Body mass index is 56.12 kg/m.  SLEEP STUDY DOCUMENTS  Result Date: 04/13/2023 Ordered by an unspecified provider.    Independent interpretation of notes and tests performed by another provider:   None  Procedures performed:   Indication: Cerumen impaction of the ear(s)  Medical necessity statement: On physical examination, cerumen impairs clinically significant portions of the external auditory canal, and tympanic membrane. Noted obstructive, copious cerumen that cannot be removed without magnification and instrumentations requiring physician skills Consent: Discussed benefits and risks of procedure and verbal consent obtained Procedure: Patient was prepped for the procedure. Utilized an otoscope to assess and take note of the ear canal, the tympanic membrane, and the presence, amount, and placement of the cerumen. Gentle water irrigation and soft plastic curette was utilized to remove cerumen.  Post procedure examination: shows cerumen was completely removed. Patient tolerated procedure well. The patient is made aware that they may experience temporary vertigo, temporary hearing loss, and temporary discomfort. If these symptom last for more than 24 hours to call the clinic or proceed to the ED.   Pertinent History, Exam, Impression, and Recommendations:   Bill Smith was seen today for ear fullness.  Otitis, bilateral Assessment & Plan: Patient presents regarding bilateral, right greater than left ear pain in the setting of  previously noted rhinitis assessed at last visit 8 days ago on 6/25.  At that time he was placed on azithromycin, as needed Phenergan DM, advised Mucinex twice daily.  He states that he noted improvement with residual features at the ears and cough productive of scant dark sputum.  Denies shortness of air.  After irrigation and instrument manipulation, bilateral TMs visualized, retracted TMs, very mildly erythematous, subtle fluid posterior, nasopharynx with minimally erythematous, swollen, right greater than left, oropharynx benign, fields are clear.  Plan as follows: - Ciprodex bilaterally - Start Flonase - Start Mucinex twice daily  Orders: -     Ciprofloxacin-dexAMETHasone; Place 4 drops into both ears 2 (two) times daily for 7 days.  Dispense: 2.8 mL; Refill: 0  Class 3 obesity with alveolar hypoventilation, serious comorbidity, and body mass index (BMI) of 50.0 to 59.9 in adult [facility]) Assessment & Plan: Patient checked with his insurance and Wegovy/Zepbound are not covered.  A referral to weight management was placed at last visit, patient has not heard from them.  Contact information provided to patient via MyChart for him to coordinate scheduling.  Will follow peripherally.      Orders & Medications Meds ordered this encounter  Medications   ciprofloxacin-dexamethasone (CIPRODEX) OTIC suspension    Sig: Place 4 drops into both ears 2 (two) times daily for 7 days.    Dispense:  2.8 mL    Refill:  0   No orders of the defined types were placed in this encounter.    No follow-ups on file.     Jerrol Banana, MD, [facility]   Primary Care Sports Medicine Primary Care  and Sports Medicine at [facility]

## 2023-04-18 NOTE — Assessment & Plan Note (Signed)
Patient presents regarding bilateral, right greater than left ear pain in the setting of previously noted rhinitis assessed at last visit 8 days ago on 6/25.  At that time he was placed on azithromycin, as needed Phenergan DM, advised Mucinex twice daily.  He states that he noted improvement with residual features at the ears and cough productive of scant dark sputum.  Denies shortness of air.  After irrigation and instrument manipulation, bilateral TMs visualized, retracted TMs, very mildly erythematous, subtle fluid posterior, nasopharynx with minimally erythematous, swollen, right greater than left, oropharynx benign, fields are clear.  Plan as follows: - Ciprodex bilaterally - Start Flonase - Start Mucinex twice daily

## 2023-04-22 ENCOUNTER — Telehealth (HOSPITAL_BASED_OUTPATIENT_CLINIC_OR_DEPARTMENT_OTHER): Payer: Managed Care, Other (non HMO) | Admitting: Pulmonary Disease

## 2023-04-22 DIAGNOSIS — G4733 Obstructive sleep apnea (adult) (pediatric): Secondary | ICD-10-CM | POA: Diagnosis not present

## 2023-04-22 NOTE — Telephone Encounter (Signed)
Split study Severe OSA with AHI 42/h & low sat 71% Advise autoCPAP 12-20 cm with EPR 3

## 2023-04-23 NOTE — Telephone Encounter (Signed)
Patient scheduled to see Bill Smith 07/09 to discuss results.

## 2023-04-23 NOTE — Telephone Encounter (Signed)
Will await Katie's response.  

## 2023-04-24 ENCOUNTER — Ambulatory Visit: Payer: Managed Care, Other (non HMO) | Admitting: Nurse Practitioner

## 2023-04-24 ENCOUNTER — Encounter: Payer: Self-pay | Admitting: Nurse Practitioner

## 2023-04-24 VITALS — BP 136/70 | HR 80 | Ht 69.0 in | Wt 383.0 lb

## 2023-04-24 DIAGNOSIS — Z6841 Body Mass Index (BMI) 40.0 and over, adult: Secondary | ICD-10-CM

## 2023-04-24 DIAGNOSIS — G4733 Obstructive sleep apnea (adult) (pediatric): Secondary | ICD-10-CM | POA: Diagnosis not present

## 2023-04-24 NOTE — Telephone Encounter (Signed)
Results discussed with patient during 04/24/2023 visit.

## 2023-04-24 NOTE — Assessment & Plan Note (Signed)
Severe OSA with AHI 42/h. Discussed risks of untreated severe OSA and treatment options. Recommendation for CPAP given severity and current BMI. He is willing to restart - orders sent for new CPAP 12-20 cmH2O, EPR 3, mask of choice and heated humidity. Educated on proper use/care of device. Risks/benefits reviewed. Aware of safe driving practices.   Patient Instructions  Restart CPAP 12-20 cmH2O, every night, minimum of 4-6 hours a night.  Change equipment every 30 days or as directed by DME. Wash your tubing with warm soap and water daily, hang to dry. Wash humidifier portion weekly. Use bottled, distilled water and change daily Be aware of reduced alertness and do not drive or operate heavy machinery if experiencing this or drowsiness.  Exercise encouraged, as tolerated. Healthy weight management discussed.  Notify if persistent daytime sleepiness occurs even with consistent use of CPAP.  We discussed how untreated sleep apnea puts an individual at risk for cardiac arrhthymias, pulm HTN, DM, stroke and increases their risk for daytime accidents. We also briefly reviewed treatment options including weight loss, side sleeping position, oral appliance, CPAP therapy or referral to ENT for possible surgical options  Follow up in 12 weeks with Dr. Belia Heman or Philis Nettle, or sooner if needed. Ok to do video visit.

## 2023-04-24 NOTE — Patient Instructions (Addendum)
Restart CPAP 12-20 cmH2O, every night, minimum of 4-6 hours a night.  Change equipment every 30 days or as directed by DME. Wash your tubing with warm soap and water daily, hang to dry. Wash humidifier portion weekly. Use bottled, distilled water and change daily Be aware of reduced alertness and do not drive or operate heavy machinery if experiencing this or drowsiness.  Exercise encouraged, as tolerated. Healthy weight management discussed.  Notify if persistent daytime sleepiness occurs even with consistent use of CPAP.  We discussed how untreated sleep apnea puts an individual at risk for cardiac arrhthymias, pulm HTN, DM, stroke and increases their risk for daytime accidents. We also briefly reviewed treatment options including weight loss, side sleeping position, oral appliance, CPAP therapy or referral to ENT for possible surgical options  Follow up in 12 weeks with Dr. Belia Smith or Bill Smith, or sooner if needed. Ok to do video visit.

## 2023-04-24 NOTE — Progress Notes (Signed)
@Patient  ID: Bill Smith, male    DOB: April 12, 1976, 47 y.o.   MRN: [id number]  Chief Complaint  Patient presents with   Follow-up    Discuss sleep study.     Referring provider: Jerrol Banana, MD  HPI: 47 year old male, never smoker followed for severe OSA. Past medical history significant for OSA on CPAP, GERD, ED.   TEST/EVENTS:  04/22/2023 PSG: AHI 42/h, SpO2 low 71%  03/09/2023: OV with Malita Ignasiak NP for sleep consult. He was diagnosed with CPAP around 10 years ago. He has been on CPAP therapy since. He has his original machine. He has also not changed out his mask or headgear in years. He's having trouble with the mask fitting now (nasal mask). Hasn't been wearing his CPAP on a consistent basis because of this. He is tired without CPAP. He has a lot of issues with dry mouth. He also has been told he has  loud snoring and videos of him stopping breathing. He has had sleep paralysis in the past when he hasn't worn his CPAP. Has not occurred in quite some time. He denies any morning headaches, drowsy driving, sleep parasomnias.  He goes to bed around 11 pm. Falls asleep within 30 minutes to an hour. Wakes once or twice a night. Gets up around 6 am. No sleep aids. 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 Last sleep study was in [place]. He does not have these results. No download on machine available. Unsure of settings.  No history of cardiac disease, stroke or DM. He is a never smoker. Drinks a few beers a week. Has 1 cup of coffee most mornings. Lives alone. No significant family history.  Ep[place] 3  04/24/2023: Today - follow up Patient presents today with his significant other for follow up to discuss sleep study which revealed severe OSA. Feeling unchanged compared to his last visit. Tired without his CPAP. Used to receive benefit from using it. He is a very loud snoring and stops breathing when he sleeps. Denies any drowsy driving or sleep  parasomnias/paralysis.   Allergies  Allergen Reactions   Tessalon [Benzonatate] Itching    Immunization History  Administered Date(s) Administered   Tdap 12/05/2021    Past Medical History:  Diagnosis Date   Rotator cuff impingement syndrome, right 10/03/2021   Vitamin D deficiency     Tobacco History: Social History   Tobacco Use  Smoking Status Never  Smokeless Tobacco Never   Counseling given: Not Answered   Outpatient Medications Prior to Visit  Medication Sig Dispense Refill   tadalafil (CIALIS) 5 MG tablet Take 1-4 tablets (5-20 mg total) by mouth daily as needed for erectile dysfunction (>=30 minutes prior to anticipated sexual activity). 90 tablet 0   ciprofloxacin-dexamethasone (CIPRODEX) OTIC suspension Place 4 drops into both ears 2 (two) times daily for 7 days. (Patient not taking: Reported on 04/24/2023) 2.8 mL 0   promethazine-dextromethorphan (PROMETHAZINE-DM) 6.25-15 MG/5ML syrup Take 5 mLs by mouth 4 (four) times daily as needed for cough. (Patient not taking: Reported on 04/24/2023) 118 mL 0   No facility-administered medications prior to visit.     Review of Systems:   Constitutional: No night sweats, fevers, chills, or lassitude. +weight gain, fatigue  HEENT: No headaches, difficulty swallowing, tooth/dental problems, or sore throat. No sneezing, itching, ear ache, nasal congestion, or post nasal drip. +dry mouth CV:  No chest pain, orthopnea, PND, swelling in lower extremities, anasarca, dizziness,  palpitations, syncope Resp: +snoring, witnessed apneas. No shortness of breath with exertion or at rest. No excess mucus or change in color of mucus. No productive or non-productive. No hemoptysis. No wheezing.  No chest wall deformity GI:  No heartburn, indigestion, abdominal pain, nausea, vomiting, diarrhea, change in bowel habits, loss of appetite, bloody stools.  GU: No dysuria, change in color of urine, urgency or frequency, nocturia Skin: No rash,  lesions, ulcerations MSK:  No joint pain or swelling.  Neuro: No dizziness or lightheadedness.  Psych: No depression or anxiety. Mood stable.     Physical Exam:  BP 136/70 (BP Location: Left Wrist, Cuff Size: Large)   Pulse 80   Ht 5\' 9"  (1.753 m)   Wt (!) 383 lb (173.7 kg)   SpO2 97%   BMI 56.56 kg/m   GEN: Pleasant, interactive, well-kempt; morbidly obese; in no acute distress. HEENT:  Normocephalic and atraumatic. PERRLA. Sclera white. Nasal turbinates pink, moist and patent bilaterally. No rhinorrhea present. Oropharynx pink and moist, without exudate or edema. No lesions, ulcerations, or postnasal drip. Mallampati III NECK:  Supple w/ fair ROM. No JVD present. Normal carotid impulses w/o bruits. Thyroid symmetrical with no goiter or nodules palpated. No lymphadenopathy.   CV: RRR, no m/r/g, no peripheral edema. Pulses intact, +2 bilaterally. No cyanosis, pallor or clubbing. PULMONARY:  Unlabored, regular breathing. Clear bilaterally A&P w/o wheezes/rales/rhonchi. No accessory muscle use.  GI: BS present and normoactive. Soft, non-tender to palpation. No organomegaly or masses detected.  MSK: No erythema, warmth or tenderness. Cap refil <2 sec all extrem. No deformities or joint swelling noted.  Neuro: A/Ox3. No focal deficits noted.   Skin: Warm, no lesions or rashe Psych: Normal affect and behavior. Judgement and thought content appropriate.     Lab Results:  CBC    Component Value Date/Time   WBC 6.2 01/17/2023 0957   RBC 4.72 01/17/2023 0957   HGB 14.2 01/17/2023 0957   HCT 43.5 01/17/2023 0957   PLT 372 01/17/2023 0957   MCV 92 01/17/2023 0957   MCH 30.1 01/17/2023 0957   MCHC 32.6 01/17/2023 0957   RDW 11.3 (L) 01/17/2023 0957   LYMPHSABS 1.7 12/06/2021 0838   EOSABS 0.1 12/06/2021 0838   BASOSABS 0.0 12/06/2021 0838    BMET    Component Value Date/Time   NA 146 (H) 01/17/2023 0957   K 5.0 01/17/2023 0957   CL 107 (H) 01/17/2023 0957   CO2 21  01/17/2023 0957   GLUCOSE 102 (H) 01/17/2023 0957   BUN 12 01/17/2023 0957   CREATININE 0.96 01/17/2023 0957   CALCIUM 9.4 01/17/2023 0957   GFRNONAA 83 06/03/2018 1709   GFRAA 96 06/03/2018 1709    BNP No results found for: "BNP"   Imaging:  SLEEP STUDY DOCUMENTS  Result Date: 04/13/2023 Ordered by an unspecified provider.         No data to display          No results found for: "NITRICOXIDE"      Assessment & Plan:   OSA on CPAP Severe OSA with AHI 42/h. Discussed risks of untreated severe OSA and treatment options. Recommendation for CPAP given severity and current BMI. He is willing to restart - orders sent for new CPAP 12-20 cmH2O, EPR 3, mask of choice and heated humidity. Educated on proper use/care of device. Risks/benefits reviewed. Aware of safe driving practices.   Patient Instructions  Restart CPAP 12-20 cmH2O, every night, minimum of 4-6 hours a night.  Change equipment every 30 days or as directed by DME. Wash your tubing with warm soap and water daily, hang to dry. Wash humidifier portion weekly. Use bottled, distilled water and change daily Be aware of reduced alertness and do not drive or operate heavy machinery if experiencing this or drowsiness.  Exercise encouraged, as tolerated. Healthy weight management discussed.  Notify if persistent daytime sleepiness occurs even with consistent use of CPAP.  We discussed how untreated sleep apnea puts an individual at risk for cardiac arrhthymias, pulm HTN, DM, stroke and increases their risk for daytime accidents. We also briefly reviewed treatment options including weight loss, side sleeping position, oral appliance, CPAP therapy or referral to ENT for possible surgical options  Follow up in 12 weeks with Dr. Belia Heman or Philis Nettle, or sooner if needed. Ok to do video visit.    Class 3 severe obesity with body mass index (BMI) of 50.0 to 59.9 in adult (HCC) BMI 56. Healthy weight loss encouraged.   I  spent 35 minutes of dedicated to the care of this patient on the date of this encounter to include pre-visit review of records, face-to-face time with the patient discussing conditions above, post visit ordering of testing, clinical documentation with the electronic health record, making appropriate referrals as documented, and communicating necessary findings to members of the patients care team.  Noemi Chapel, NP 04/24/2023  Pt aware and understands NP's role.

## 2023-04-24 NOTE — Assessment & Plan Note (Signed)
BMI 56. Healthy weight loss encouraged.

## 2023-08-08 ENCOUNTER — Ambulatory Visit (INDEPENDENT_AMBULATORY_CARE_PROVIDER_SITE_OTHER): Payer: Managed Care, Other (non HMO) | Admitting: Internal Medicine

## 2023-08-08 ENCOUNTER — Encounter: Payer: Self-pay | Admitting: Internal Medicine

## 2023-08-08 VITALS — BP 124/70 | HR 82 | Temp 97.6°F | Ht 69.0 in | Wt 392.0 lb

## 2023-08-08 DIAGNOSIS — G4733 Obstructive sleep apnea (adult) (pediatric): Secondary | ICD-10-CM

## 2023-08-08 NOTE — Patient Instructions (Signed)
Referral to [facility] for mask fitting assessment  Continue CPAP as prescribed  Avoid secondhand smoke Avoid SICK contacts Recommend  Masking  when appropriate Recommend Keep up-to-date with vaccinations   Recommend weight loss

## 2023-08-08 NOTE — Progress Notes (Signed)
@Patient  ID: Bill Smith, male    DOB: 1976/08/22, 47 y.o.   MRN: [id number]    SYNOPSIS 47 year old male, never smoker followed for severe OSA. Past medical history significant for OSA on CPAP, GERD, ED.   TEST/EVENTS:  04/22/2023 PSG: AHI 42/h, SpO2 low 71%  03/09/2023: OV with Cobb NP for sleep consult. He was diagnosed with CPAP around 10 years ago. He has been on CPAP therapy since. He has his original machine. He has also not changed out his mask or headgear in years. He's having trouble with the mask fitting now (nasal mask). Hasn't been wearing his CPAP on a consistent basis because of this. He is tired without CPAP. He has a lot of issues with dry mouth. He also has been told he has  loud snoring and videos of him stopping breathing. He has had sleep paralysis in the past when he hasn't worn his CPAP. Has not occurred in quite some time. He denies any morning headaches, drowsy driving, sleep parasomnias.  He goes to bed around 11 pm. Falls asleep within 30 minutes to an hour. Wakes once or twice a night. Gets up around 6 am. No sleep aids. 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 Last sleep study was in [place]. He does not have these results. No download on machine available. Unsure of settings.  No history of cardiac disease, stroke or DM. He is a never smoker. Drinks a few beers a week. Has 1 cup of coffee most mornings. Lives alone. No significant family history.  Ep[place] 3  04/24/2023: Today - follow up Patient presents today with his significant other for follow up to discuss sleep study which revealed severe OSA. Feeling unchanged compared to his last visit. Tired without his CPAP. Used to receive benefit from using it. He is a very loud snoring and stops breathing when he sleeps. Denies any drowsy driving or sleep parasomnias/paralysis.    CC Follow up severe OSA   HPI  No exacerbation at this time No evidence of heart failure at this  time No evidence or signs of infection at this time No respiratory distress No fevers, chills, nausea, vomiting, diarrhea No evidence of lower extremity edema No evidence hemoptysis  Compliance report reviewed in detail with patient Overall doing well with CPAP however mask is too small Patient will need mask desensitization protocol referral to [facility]  Patient currently weighs 392 pounds Recommend weight loss  AHI was significantly reduced down to 0.1 previous AHI recorded as 42 Auto CPAP 12-20   Allergies  Allergen Reactions   Tessalon [Benzonatate] Itching    Immunization History  Administered Date(s) Administered   Tdap 12/05/2021    Past Medical History:  Diagnosis Date   Rotator cuff impingement syndrome, right 10/03/2021   Vitamin D deficiency     Tobacco History: Social History   Tobacco Use  Smoking Status Never  Smokeless Tobacco Never   Counseling given: Not Answered   Outpatient Medications Prior to Visit  Medication Sig Dispense Refill   promethazine-dextromethorphan (PROMETHAZINE-DM) 6.25-15 MG/5ML syrup Take 5 mLs by mouth 4 (four) times daily as needed for cough. (Patient not taking: Reported on 04/24/2023) 118 mL 0   tadalafil (CIALIS) 5 MG tablet Take 1-4 tablets (5-20 mg total) by mouth daily as needed for erectile dysfunction (>=30 minutes prior to anticipated sexual activity). 90 tablet 0   No facility-administered medications prior to visit.     BP  124/70 (BP Location: Right Arm, Patient Position: Sitting, Cuff Size: Large)   Pulse 82   Temp 97.6 F (36.4 C) (Temporal)   Ht 5\' 9"  (1.753 m)   Wt (!) 392 lb (177.8 kg)   SpO2 95%   BMI 57.89 kg/m     Review of Systems: Gen:  Denies  fever, sweats, chills weight loss  HEENT: Denies blurred vision, double vision, ear pain, eye pain, hearing loss, nose bleeds, sore throat Cardiac:  No dizziness, chest pain or heaviness, chest tightness,edema, No JVD Resp:   No cough, -sputum  production, -shortness of breath,-wheezing, -hemoptysis,  Other:  All other systems negative   Physical Examination:   General Appearance: No distress  EYES PERRLA, EOM intact.   NECK Supple, No JVD Pulmonary: normal breath sounds, No wheezing.  CardiovascularNormal S1,S2.  No m/r/g.   Abdomen: Benign, Soft, non-tender. Neurology UE/LE 5/5 strength, no focal deficits Ext pulses intact, cap refill intact ALL OTHER ROS ARE NEGATIVE    Lab Results:  CBC    Component Value Date/Time   WBC 6.2 01/17/2023 0957   RBC 4.72 01/17/2023 0957   HGB 14.2 01/17/2023 0957   HCT 43.5 01/17/2023 0957   PLT 372 01/17/2023 0957   MCV 92 01/17/2023 0957   MCH 30.1 01/17/2023 0957   MCHC 32.6 01/17/2023 0957   RDW 11.3 (L) 01/17/2023 0957   LYMPHSABS 1.7 12/06/2021 0838   EOSABS 0.1 12/06/2021 0838   BASOSABS 0.0 12/06/2021 0838    BMET    Component Value Date/Time   NA 146 (H) 01/17/2023 0957   K 5.0 01/17/2023 0957   CL 107 (H) 01/17/2023 0957   CO2 21 01/17/2023 0957   GLUCOSE 102 (H) 01/17/2023 0957   BUN 12 01/17/2023 0957   CREATININE 0.96 01/17/2023 0957   CALCIUM 9.4 01/17/2023 0957   GFRNONAA 83 06/03/2018 1709   GFRAA 96 06/03/2018 1709      Assessment & Plan:    OSA on CPAP Severe OSA with AHI 42/h. Discussed risks of untreated severe OSA and treatment options.  Continue auto CPAP as prescribed 12-20 Recommend weight loss Mask [facility] to [facility] Current mass is too small    Patient Instructions  Continue to use CPAP every night, minimum of 4-6 hours a night.  Change equipment every 30 days or as directed by DME.  Wash your tubing with warm soap and water daily, hang to dry. Wash humidifier portion weekly. Use bottled, distilled water and change daily   Be aware of reduced alertness and do not drive or operate heavy machinery if experiencing this or drowsiness.  Exercise encouraged, as tolerated. Encouraged proper weight management.  Important to get  eight or more hours of sleep  Limiting the use of the computer and television before bedtime.  Decrease naps during the day, so night time sleep will become enhanced.  Limit caffeine, and sleep deprivation.  HTN, stroke, uncontrolled diabetes and heart failure are potential risk factors.  Risk of untreated sleep apnea including cardiac arrhthymias, stroke, DM, pulm HTN.       Class 3 severe obesity with body mass index (BMI) of 50.0 to 59.9 in adult (HCC) BMI 56. Healthy weight loss encouraged.    Obesity current weight 392 -recommend significant weight loss -recommend changing diet  Deconditioned state -Recommend increased daily activity and exercise   MEDICATION ADJUSTMENTS/LABS AND TESTS ORDERED: Referral to [facility] for mask fitting assessment  Continue CPAP as prescribed  Avoid secondhand smoke Avoid SICK contacts Recommend  Masking  when appropriate Recommend Keep up-to-date with vaccinations   Recommend weight loss   CURRENT MEDICATIONS REVIEWED AT LENGTH WITH PATIENT TODAY   Patient  satisfied with Plan of action and management. All questions answered  Follow up 6 months  Total Time Spent  32 mins   Wallis Bamberg Santiago Glad, M.D.  [facility]  Medical Director [facility] Medical Director [facility] Cardio-Pulmonary Department

## 2023-08-20 ENCOUNTER — Other Ambulatory Visit (HOSPITAL_BASED_OUTPATIENT_CLINIC_OR_DEPARTMENT_OTHER): Payer: Managed Care, Other (non HMO)

## 2023-08-21 ENCOUNTER — Ambulatory Visit (HOSPITAL_BASED_OUTPATIENT_CLINIC_OR_DEPARTMENT_OTHER): Payer: Managed Care, Other (non HMO) | Attending: Internal Medicine | Admitting: Radiology

## 2023-08-21 DIAGNOSIS — G4733 Obstructive sleep apnea (adult) (pediatric): Secondary | ICD-10-CM

## 2023-12-12 ENCOUNTER — Encounter (INDEPENDENT_AMBULATORY_CARE_PROVIDER_SITE_OTHER): Payer: Self-pay | Admitting: Physician Assistant

## 2023-12-12 ENCOUNTER — Ambulatory Visit (INDEPENDENT_AMBULATORY_CARE_PROVIDER_SITE_OTHER): Payer: BC Managed Care – PPO | Admitting: Physician Assistant

## 2023-12-12 VITALS — BP 121/75 | HR 85 | Temp 98.9°F | Ht 69.0 in | Wt 390.0 lb

## 2023-12-12 DIAGNOSIS — Z6841 Body Mass Index (BMI) 40.0 and over, adult: Secondary | ICD-10-CM

## 2023-12-12 DIAGNOSIS — N5203 Combined arterial insufficiency and corporo-venous occlusive erectile dysfunction: Secondary | ICD-10-CM

## 2023-12-12 DIAGNOSIS — G4733 Obstructive sleep apnea (adult) (pediatric): Secondary | ICD-10-CM

## 2023-12-12 DIAGNOSIS — K219 Gastro-esophageal reflux disease without esophagitis: Secondary | ICD-10-CM | POA: Diagnosis not present

## 2023-12-12 DIAGNOSIS — Z0289 Encounter for other administrative examinations: Secondary | ICD-10-CM

## 2023-12-12 DIAGNOSIS — E7849 Other hyperlipidemia: Secondary | ICD-10-CM

## 2023-12-12 DIAGNOSIS — E559 Vitamin D deficiency, unspecified: Secondary | ICD-10-CM

## 2023-12-12 DIAGNOSIS — R7309 Other abnormal glucose: Secondary | ICD-10-CM | POA: Diagnosis not present

## 2023-12-12 DIAGNOSIS — E66813 Obesity, class 3: Secondary | ICD-10-CM

## 2023-12-12 NOTE — Progress Notes (Signed)
 Office: [phone]  /  Fax: [phone]   Initial Visit  Bill Smith was seen in clinic today to evaluate for obesity. He is interested in losing weight to improve overall health and reduce the risk of weight related complications. He presents today to review program treatment options, initial physical assessment, and evaluation.     He was referred by: PCP  When asked what else they would like to accomplish? He states: Adopt healthier eating patterns, Improve energy levels and physical activity, Improve existing medical conditions, Improve quality of life, Improve appearance, Improve self-confidence, and Lose a target amount of weight : 225 lbs in 12-24 months.  Weight history: Has done weight loss clinics In [place]- Phentermine- Lost 100 lbs but unable to sustain. Many years ago was on injections- ? HCG and lost 70 lbs but unable to sustain.   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   When asked how has your weight affected you? He states: Contributed to medical problems, Contributed to orthopedic problems or mobility issues, Having fatigue, Having poor endurance, and Problems with eating patterns  Some associated conditions: Arthritis:shoulder, ?back, Hyperlipidemia, OSA, GERD, and Vitamin D Deficiency  Contributing factors: Family history of obesity, Disruption of circadian rhythm / sleep disordered breathing, Consumption of processed foods, Moderate to high levels of stress, Reduced physical activity, Eating patterns, Strong orexigenic signaling and inadequate inhibitory control , Slow metabolism for age, Frequent travel, and Enticing relationships and enviroment  Weight promoting medications identified: None  Current nutrition plan: None  Current level of physical activity: Walking 15-30 minutes, NEAT, and Other: chair exercises  Current or previous pharmacotherapy: Phentermine and Other: HCG injections  Response to  medication: Lost weight initially but was unable to sustain weight loss Interested in medication to assist with weight loss in addition to overall program as tired of losing and then regaining even more weight.   Past medical history includes:   Past Medical History:  Diagnosis Date   Rotator cuff impingement syndrome, right 10/03/2021   Vitamin D deficiency      Objective:   BP 121/75   Pulse 85   Temp 98.9 F (37.2 C)   Ht 5\' 9"  (1.753 m)   Wt (!) 390 lb (176.9 kg)   SpO2 98%   BMI 57.59 kg/m  He was weighed on the bioimpedance scale: Body mass index is 57.59 kg/m.  Peak Weight:392 lbs , Body Fat%:45.1%, Visceral Fat Rating:36, Weight trend over the last 12 months: Increasing  General:  Alert, oriented and cooperative. Patient is in no acute distress.  Respiratory: Normal respiratory effort, no problems with respiration noted   Gait: able to ambulate independently  Mental Status: Normal mood and affect. Normal behavior. Normal judgment and thought content.   DIAGNOSTIC DATA REVIEWED:  BMET    Component Value Date/Time   NA 146 (H) 01/17/2023 0957   K 5.0 01/17/2023 0957   CL 107 (H) 01/17/2023 0957   CO2 21 01/17/2023 0957   GLUCOSE 102 (H) 01/17/2023 0957   BUN 12 01/17/2023 0957   CREATININE 0.96 01/17/2023 0957   CALCIUM 9.4 01/17/2023 0957   GFRNONAA 83 06/03/2018 1709   GFRAA 96 06/03/2018 1709   Lab Results  Component Value Date   HGBA1C 5.4 01/17/2023   HGBA1C 4.8 06/03/2018   No results found for: "INSULIN" CBC    Component Value Date/Time   WBC 6.2 01/17/2023 0957   RBC 4.72 01/17/2023 0957   HGB 14.2  01/17/2023 0957   HCT 43.5 01/17/2023 0957   PLT 372 01/17/2023 0957   MCV 92 01/17/2023 0957   MCH 30.1 01/17/2023 0957   MCHC 32.6 01/17/2023 0957   RDW 11.3 (L) 01/17/2023 0957   Iron/TIBC/Ferritin/ %Sat No results found for: "IRON", "TIBC", "FERRITIN", "IRONPCTSAT" Lipid Panel     Component Value Date/Time   CHOL 215 (H) 01/17/2023  0957   TRIG 70 01/17/2023 0957   HDL 59 01/17/2023 0957   CHOLHDL 3.6 01/17/2023 0957   LDLCALC 144 (H) 01/17/2023 0957   Hepatic Function Panel     Component Value Date/Time   PROT 7.1 01/17/2023 0957   ALBUMIN 4.1 01/17/2023 0957   AST 13 01/17/2023 0957   ALT 12 01/17/2023 0957   ALKPHOS 60 01/17/2023 0957   BILITOT 0.3 01/17/2023 0957      Component Value Date/Time   TSH 1.580 01/17/2023 0957     Assessment and Plan:   Gastroesophageal reflux disease, unspecified whether esophagitis present  Elevated glucose  Combined arterial insufficiency and corporo-venous occlusive erectile dysfunction  OSA on CPAP  Other hyperlipidemia  Vitamin D deficiency  Class 3 severe obesity due to excess calories with serious comorbidity and body mass index (BMI) of 50.0 to 59.9 in adult [facility])        Obesity Treatment / Action Plan:  Patient will work on garnering support from family and friends to begin weight loss journey. Will work on eliminating or reducing the presence of highly palatable, calorie dense foods in the home. Will complete provided nutritional and psychosocial assessment questionnaire before the next appointment. Will be scheduled for indirect calorimetry to determine resting energy expenditure in a fasting state.  This will allow [place] to create a reduced calorie, high-protein meal plan to promote loss of fat mass while preserving muscle mass. Will think about ideas on how to incorporate physical activity into their daily routine. Will avoid skipping meals which may result in increased hunger signals and overeating at certain times. Will work on managing stress via relaxation methods as this may result in unhealthy eating patterns. Will work on reading labels, making healthier choices and watching portion sizes. Counseled on the health benefits of losing 5%-15% of total body weight. Will work on improving sleep hygiene and trying to obtain at least 7 hours of  sleep. Was counseled on nutritional approaches to weight loss and benefits of reducing processed foods and consuming plant-based foods and high quality protein as part of nutritional weight management. Was counseled on pharmacotherapy and role as an adjunct in weight management.   Obesity Education Performed Today:  He was weighed on the bioimpedance scale and results were discussed and documented in the synopsis.  We discussed obesity as a disease and the importance of a more detailed evaluation of all the factors contributing to the disease.  We discussed the importance of long term lifestyle changes which include nutrition, exercise and behavioral modifications as well as the importance of customizing this to his specific health and social needs.  We discussed the benefits of reaching a healthier weight to alleviate the symptoms of existing conditions and reduce the risks of the biomechanical, metabolic and psychological effects of obesity.  Lewayne Pauley appears to be in the action stage of change and states they are ready to start intensive lifestyle modifications and behavioral modifications.  30 minutes was spent today on this visit including the above counseling, pre-visit chart review, and post-visit documentation.  Reviewed by clinician on day of visit:  allergies, medications, problem list, medical history, surgical history, family history, social history, and previous encounter notes pertinent to obesity diagnosis.   Dearies Meikle,PA-C

## 2024-01-17 ENCOUNTER — Ambulatory Visit (INDEPENDENT_AMBULATORY_CARE_PROVIDER_SITE_OTHER): Payer: Self-pay | Admitting: Family Medicine

## 2024-01-21 ENCOUNTER — Encounter (INDEPENDENT_AMBULATORY_CARE_PROVIDER_SITE_OTHER): Payer: Self-pay | Admitting: Family Medicine

## 2024-01-21 ENCOUNTER — Ambulatory Visit (INDEPENDENT_AMBULATORY_CARE_PROVIDER_SITE_OTHER): Payer: Self-pay | Admitting: Family Medicine

## 2024-01-21 VITALS — BP 122/74 | HR 76 | Temp 97.9°F | Ht 69.5 in | Wt 389.0 lb

## 2024-01-21 DIAGNOSIS — R5383 Other fatigue: Secondary | ICD-10-CM

## 2024-01-21 DIAGNOSIS — E559 Vitamin D deficiency, unspecified: Secondary | ICD-10-CM | POA: Diagnosis not present

## 2024-01-21 DIAGNOSIS — R0602 Shortness of breath: Secondary | ICD-10-CM

## 2024-01-21 DIAGNOSIS — N5203 Combined arterial insufficiency and corporo-venous occlusive erectile dysfunction: Secondary | ICD-10-CM

## 2024-01-21 DIAGNOSIS — F5089 Other specified eating disorder: Secondary | ICD-10-CM | POA: Diagnosis not present

## 2024-01-21 DIAGNOSIS — Z1331 Encounter for screening for depression: Secondary | ICD-10-CM

## 2024-01-21 DIAGNOSIS — E66813 Obesity, class 3: Secondary | ICD-10-CM | POA: Diagnosis not present

## 2024-01-21 DIAGNOSIS — G4733 Obstructive sleep apnea (adult) (pediatric): Secondary | ICD-10-CM

## 2024-01-21 DIAGNOSIS — Z6841 Body Mass Index (BMI) 40.0 and over, adult: Secondary | ICD-10-CM

## 2024-01-21 NOTE — Progress Notes (Signed)
 Bill Smith, D.O.  ABFM, ABOM Specializing in Clinical Bariatric Medicine Office located at: [address]  [place], [place]  [address]   Bariatric Medicine Visit  Dear Bill Smith, Bill Bob, MD   Thank you for referring Bill Smith to our clinic today for evaluation.  We performed a consultation to discuss his options for treatment and educate the patient on his disease state.  The following note includes my evaluation and treatment recommendations.   Please do not hesitate to reach out to me directly if you have any further concerns.   Assessment and Plan:   Orders Placed This Encounter  Procedures   CBC with Differential/Platelet   Comprehensive metabolic panel with GFR   Hemoglobin A1c   VITAMIN D 25 Hydroxy (Vit-D Deficiency, Fractures)   Folate   Insulin, random   Vitamin B12   TSH   T4, free   Lipid panel   EKG 12-Lead   There are no discontinued medications.   No orders of the defined types were placed in this encounter.   FOR THE DISEASE OF OBESITY:  Class 3 severe obesity due to excess calories with serious comorbidity and body mass index (BMI) of 50.0 to 59.9 in adult [name]) - starting 56.64 Assessment & Plan: Recommended Dietary Goals Bill Smith is currently in the action stage of change. As such, his goal is to start our weight management plan.  He has agreed to: Bill Smith following Category 4 MP with B/L options   Behavioral Intervention We discussed the following Behavioral Modification Strategies today: weighing foods before eating, decreasing simple carbohydrates , avoiding skipping meals, and keeping healthy foods at home  Additional resources provided today: Handout on CAT 4 meal plan, Handout on CAT 3-4 breakfast options, and Handout on CAT 3-4 lunch options  Evidence-based interventions for health behavior change were utilized today including the discussion of self monitoring techniques, problem-solving barriers and SMART goal setting techniques.     Pt will specifically work on: n/a   Recommended Physical Activity Goals Bill Smith has been advised to work up to 150 minutes of moderate intensity aerobic activity a week and strengthening exercises 2-3 times per week for cardiovascular health, weight loss maintenance and preservation of muscle mass.   He has agreed to : maintain current level of activity.    Pharmacotherapy We both agreed to : continue with nutritional and behavioral strategies   FOR ASSOCIATED CONDITIONS ADDRESSED TODAY:  Fatigue Assessment & Plan: Bill Smith does feel that his weight is causing his energy to be lower than it should be. Fatigue may be related to obesity, depression or many other causes. he does not appear to have any red flag symptoms and this appears to most likely be related to his current lifestyle habits and dietary intake.  Labs will be ordered and reviewed with him at their next office visit in two weeks.  Relevant Orders: -     EKG 12-Lead -     CBC with Differential/Platelet -     Comprehensive metabolic panel with GFR -     Hemoglobin A1c -     VITAMIN D 25 Hydroxy (Vit-D Deficiency, Fractures) -     Folate -     Insulin, random -     Vitamin B12 -     TSH -     T4, free -     Lipid panel   Epworth sleepiness scale is 3 and appears to be within normal limits. Bill Smith reports daytime somnolence and admits to  waking up still tired. Patient has a history of symptoms of daytime fatigue. Bill Smith generally gets 5 or 6 hours of sleep per night, and states that he has generally unrestful sleep. Snoring is present. Apneic episodes are present.   ECG: Performed and reviewed/ interpreted independently.  Normal sinus rhythm, rate 71 bpm; reassuring without any acute abnormalities, will continue to monitor for symptoms    Shortness of breath on exertion Assessment & Plan: Bill Smith does feel that he gets out of breath more easily than he used to when he exercises and seems to be worsening over time with  weight gain.  This has gotten worse recently. Bill Smith denies shortness of breath at rest or orthopnea. Pt denies chest pain, dizziness, heart palpitations, or excessive diaphoresis or nausea with activity.  This is not new and is ongoing.  Bill Smith's shortness of breath appears to be obesity related and exercise induced, as they do not appear to have any "red flag" symptoms/ concerns today.  Also, this condition appears to be related to a state of poor cardiovascular conditioning   Obtain labs today and will be reviewed with him at their next office visit in two weeks.  Indirect Calorimeter completed today to help guide our dietary regimen. It shows a VO2 of 452 and a REE of 3125.  His calculated basal metabolic rate is 8295 thus his resting energy expenditure is better than expected.  Patient agreed to work on weight loss at this time.  As Bill Smith progresses through our weight loss program, we will gradually increase exercise as tolerated to treat his current condition. If [name] follows our recommendations and loses 5-10% of their weight without improvement of his shortness of breath or if at any time, symptoms become more concerning, they agree to urgently follow up with their PCP/specialist for further consideration/ evaluation. Bill Smith verbalizes agreement with this plan.    Depression Screen Other disorder of eating - emotional eating Assessment & Plan: His Food and Mood (modified PHQ-9) score was 10. Pt is not currently on any medications. Bill Smith's mood is currently stable. Pt denies any depressive symptoms. He admits to eating when stressed, bored, and in the car. Behavior modification techniques were discussed today to help deal with emotional/ non-hunger eating behaviors including but not limited to exercise for stress management and self care activities like adequate sleep (7-9 hrs/night). Patient was referred to Bill Smith, our Bariatric Psychologist, for evaluation due to his elevated PHQ-9 score and  significant struggles with emotional eating. [name] accepts this referral and agrees to make appointment. We will continue to monitor closely alongside specialist.   Vitamin D deficiency Assessment & Plan: Bill Smith is taking OTC Vit D 1000 units once daily. Reviewed last labs with pt and Vit D levels were extremely low at 7.7. I reviewed possible symptoms of low Vitamin D:  low energy, depressed mood, muscle aches, joint aches, osteoporosis etc. with patient. Will recheck Vit D levels today.    OSA on CPAP Assessment & Plan: Bill Smith was diagnosed about 13-14 years ago. Condition currently managed by Dr. Belia Heman of pulmonology. In the past he used a CPAP machine for about two years. He was also on Phentermine during this time and lost over 100 lbs. With this weight loss, pt endorses his sleep quality improved so he stopped using CPAP. After stopping medication, pt regained weight. He followed up with pulmonologist and restarted CPAP about 6 months ago. Stressed importance of daily compliance of CPAP for overall health and weight loss. Informed pt  that consistency with weight loss journey can improve condition. Will continue to monitor alongside pulmonology.    Combined arterial insufficiency and corporo-venous occlusive erectile dysfunction Assessment & Plan: Bill Smith is on Cialis 5 mg as needed. Pt reports getting a vasectomy about 4-5 years ago. He believes this surgery contributed to worsening of condition. Informed pt that weight loss can improve status of this condition. Continue current medication regimen. Will monitor alongside PCP.    Visceral obesity Assessment & Plan: Current visceral fat rating: 36.  The visceral fat rating should be <10 in a male. Visceral adipose tissue is a hormonally active component of total body fat. This body composition phenotype is associated with medical disorders such as metabolic syndrome, cardiovascular disease and several malignancies including prostate and colorectal  cancers. Informed pt that high visceral fat rating can also greatly increase risk of heart attack or stroke. Starting goal: Lose 7-10% of weight via prudent nutritional plan and lifestyle changes. Will monitor levels closely.  FOLLOW UP:   Follow up in 2 weeks. He was informed of the importance of frequent follow up visits to maximize his success with intensive lifestyle modifications for his multiple health conditions.  Vicki Pasqual is aware that we will review all of his lab results at our next visit.  He is aware that if anything is critical/ life threatening with the results, we will be contacting him via MyChart prior to the office visit to discuss management.    Chief Complaint:   OBESITY Bill Smith (MR# [id number]) is a pleasant 48 y.o. male who presents for evaluation and treatment of obesity and related comorbidities. Current BMI is Body mass index is 56.62 kg/m. Bill Smith has been struggling with his weight for many years and has been unsuccessful in either losing weight, maintaining weight loss, or reaching his healthy weight goal.  Bill Smith is currently in the action stage of change and ready to dedicate time achieving and maintaining a healthier weight. Bill Smith is interested in becoming our patient and working on intensive lifestyle modifications including (but not limited to) diet and exercise for weight loss.  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 Patient is married to [place]  and has 2 children. He lives with his wife, 23 y.o. son and 48 y.o. daughter.  Wears fitness tracker and gets 1700 steps daily  Wants to be 225 lbs in 2-3 years  Gained weight from stress and lack of exercise  Started gaining weight in 30s   Diets in past: No starch and no sugar, and portion control  Eats outside the home 5x/wk and gets fast food/take out 3+/wk  Craves twix and kit-kat  "Very picky eater"  Frequently snacks on honey buns, ice cream,  and candy after dinner  Skips breakfast and sometimes lunch  Drinks regular soda 1x/wk  Drinks coffee with creamer, sweeteners, and additional flavors  Juice and sweet tea  Beer/wine twice a week  Worst food habit: sweets  Subjective:   This is the patient's first visit at [facility].  The patient's NEW PATIENT PACKET that they filled out prior to today's office visit was reviewed at length and information from that paperwork was included within the following office visit note.    Included in the packet: current and past health history, medications, allergies, ROS, gynecologic history (women only), surgical history, family history, social history, weight history, weight loss surgery history (for those that have had weight loss surgery), nutritional evaluation, mood  and food questionnaire along with a depression screening (PHQ9) on all patients, an Epworth questionnaire, sleep habits questionnaire, patient life and health improvement goals questionnaire. These will all be scanned into the patient's chart under the "media" tab.   Review of Systems: Please refer to new patient packet scanned into media. Pertinent positives were addressed with patient today.  Reviewed by clinician on day of visit: allergies, medications, problem list, medical history, surgical history, family history, social history, and previous encounter notes.  During the visit, I independently reviewed the patient's EKG, bioimpedance scale results, and indirect calorimeter results. I used this information to tailor a meal plan for the patient that will help Bill Smith to lose weight and will improve his obesity-related conditions going forward.  I performed a medically necessary appropriate examination and/or evaluation. I discussed the assessment and treatment plan with the patient. The patient was provided an opportunity to ask questions and all were answered. The patient agreed with the plan and  demonstrated an understanding of the instructions. Labs were ordered today (unless patient declined them) and will be reviewed with the patient at our next visit unless more critical results need to be addressed immediately. Clinical information was updated and documented in the EMR.    Objective:   PHYSICAL EXAM: Blood pressure 122/74, pulse 76, temperature 97.9 F (36.6 C), height 5' 9.5" (1.765 m), weight (!) 389 lb (176.4 kg), SpO2 99%. Body mass index is 56.62 kg/m.  General: Well Developed, well nourished, and in no acute distress.  HEENT: Normocephalic, atraumatic; EOMI, sclerae are anicteric. Skin: Warm and dry, good turgor Chest:  Normal excursion, shape, no gross ABN Respiratory: No conversational dyspnea; speaking in full sentences NeuroM-Sk:  Normal gross ROM * 4 extremities  Psych: A and O *3, insight adequate, mood- full   Anthropometric Measurements Height: 5' 9.5" (1.765 m) Weight: (!) 389 lb (176.4 kg) BMI (Calculated): 56.64 Weight at Last Visit: N/A Weight Lost Since Last Visit: N/A Weight Gained Since Last Visit: N/A Starting Weight: 389 lb Peak Weight: 398 lb Waist Measurement : 61 inches   Body Composition  Body Fat %: 45.5 % Fat Mass (lbs): 177.4 lbs Muscle Mass (lbs): 202.2 lbs Total Body Water (lbs): 158 lbs Visceral Fat Rating : 36   Other Clinical Data Fasting: Yes Labs: Yes Today's Visit #: #1 Starting Date: 01/21/24 Comments: First Visit    DIAGNOSTIC DATA REVIEWED:  BMET    Component Value Date/Time   NA 146 (H) 01/17/2023 0957   K 5.0 01/17/2023 0957   CL 107 (H) 01/17/2023 0957   CO2 21 01/17/2023 0957   GLUCOSE 102 (H) 01/17/2023 0957   BUN 12 01/17/2023 0957   CREATININE 0.96 01/17/2023 0957   CALCIUM 9.4 01/17/2023 0957   GFRNONAA 83 06/03/2018 1709   GFRAA 96 06/03/2018 1709   Lab Results  Component Value Date   HGBA1C 5.4 01/17/2023   HGBA1C 4.8 06/03/2018   No results found for: "INSULIN" Lab Results   Component Value Date   TSH 1.580 01/17/2023   CBC    Component Value Date/Time   WBC 6.2 01/17/2023 0957   RBC 4.72 01/17/2023 0957   HGB 14.2 01/17/2023 0957   HCT 43.5 01/17/2023 0957   PLT 372 01/17/2023 0957   MCV 92 01/17/2023 0957   MCH 30.1 01/17/2023 0957   MCHC 32.6 01/17/2023 0957   RDW 11.3 (L) 01/17/2023 0957   Iron Studies No results found for: "IRON", "TIBC", "FERRITIN", "IRONPCTSAT" Lipid Panel  Component Value Date/Time   CHOL 215 (H) 01/17/2023 0957   TRIG 70 01/17/2023 0957   HDL 59 01/17/2023 0957   CHOLHDL 3.6 01/17/2023 0957   LDLCALC 144 (H) 01/17/2023 0957   Hepatic Function Panel     Component Value Date/Time   PROT 7.1 01/17/2023 0957   ALBUMIN 4.1 01/17/2023 0957   AST 13 01/17/2023 0957   ALT 12 01/17/2023 0957   ALKPHOS 60 01/17/2023 0957   BILITOT 0.3 01/17/2023 0957      Component Value Date/Time   TSH 1.580 01/17/2023 0957   Nutritional Lab Results  Component Value Date   VD25OH 7.7 (L) 01/17/2023   VD25OH 6.3 (L) 12/06/2021    Attestation Statements:   I, Bill Smith, acting as a Stage manager for Thomasene Lot, DO., have compiled all relevant documentation for today's office visit on behalf of Thomasene Lot, DO, while in the presence of [employer], DO.  Reviewed by clinician on day of visit: allergies, medications, problem list, medical history, surgical history, family history, social history, and previous encounter notes pertinent to patient's obesity diagnosis. I have spent 60 minutes in the care of the patient today including: preparing to see patient (e.g. review and interpretation of tests, old notes ), obtaining and/or reviewing separately obtained history, performing a medically appropriate examination or evaluation, counseling and educating the patient, ordering medications, test or procedures, documenting clinical information in the electronic or other health care record, independently interpreting results  and communicating results to the patient, family, or caregiver, and extensively discussed new meal plan.  Specifically: Reviewing old PCP notes, reviewing prior labs, going over prior medical history and what medications and treatment plans have worked for various conditions, and discussing potential bariatric surgery  I have reviewed the above documentation for accuracy and completeness, and I agree with the above. Bill Smith, D.O.  The 21st Century Cures Act was signed into law in 2016 which includes the topic of electronic health records.  This provides immediate access to information in MyChart.  This includes consultation notes, operative notes, office notes, lab results and pathology reports.  If you have any questions about what you read please let us know at your next visit so we can discuss your concerns and take corrective action if need be.  We are right here with you.

## 2024-01-22 LAB — CBC WITH DIFFERENTIAL/PLATELET
Basophils Absolute: 0 10*3/uL (ref 0.0–0.2)
Basos: 0 %
EOS (ABSOLUTE): 0.1 10*3/uL (ref 0.0–0.4)
Eos: 1 %
Hematocrit: 42.5 % (ref 37.5–51.0)
Hemoglobin: 14.1 g/dL (ref 13.0–17.7)
Immature Grans (Abs): 0 10*3/uL (ref 0.0–0.1)
Immature Granulocytes: 0 %
Lymphocytes Absolute: 2.2 10*3/uL (ref 0.7–3.1)
Lymphs: 29 %
MCH: 29.7 pg (ref 26.6–33.0)
MCHC: 33.2 g/dL (ref 31.5–35.7)
MCV: 90 fL (ref 79–97)
Monocytes Absolute: 0.5 10*3/uL (ref 0.1–0.9)
Monocytes: 6 %
Neutrophils Absolute: 4.8 10*3/uL (ref 1.4–7.0)
Neutrophils: 64 %
Platelets: 391 10*3/uL (ref 150–450)
RBC: 4.74 x10E6/uL (ref 4.14–5.80)
RDW: 11.1 % — ABNORMAL LOW (ref 11.6–15.4)
WBC: 7.6 10*3/uL (ref 3.4–10.8)

## 2024-01-22 LAB — COMPREHENSIVE METABOLIC PANEL WITH GFR
ALT: 17 IU/L (ref 0–44)
AST: 16 IU/L (ref 0–40)
Albumin: 4.2 g/dL (ref 4.1–5.1)
Alkaline Phosphatase: 73 IU/L (ref 44–121)
BUN/Creatinine Ratio: 12 (ref 9–20)
BUN: 13 mg/dL (ref 6–24)
Bilirubin Total: 0.4 mg/dL (ref 0.0–1.2)
CO2: 23 mmol/L (ref 20–29)
Calcium: 9.6 mg/dL (ref 8.7–10.2)
Chloride: 100 mmol/L (ref 96–106)
Creatinine, Ser: 1.12 mg/dL (ref 0.76–1.27)
Globulin, Total: 3.3 g/dL (ref 1.5–4.5)
Glucose: 126 mg/dL — ABNORMAL HIGH (ref 70–99)
Potassium: 4.6 mmol/L (ref 3.5–5.2)
Sodium: 139 mmol/L (ref 134–144)
Total Protein: 7.5 g/dL (ref 6.0–8.5)
eGFR: 81 mL/min/{1.73_m2} (ref >59)

## 2024-01-22 LAB — VITAMIN B12: Vitamin B-12: 319 pg/mL (ref 232–1245)

## 2024-01-22 LAB — HEMOGLOBIN A1C
Est. average glucose Bld gHb Est-mCnc: 126 mg/dL
Hgb A1c MFr Bld: 6 % — ABNORMAL HIGH (ref 4.8–5.6)

## 2024-01-22 LAB — LIPID PANEL
Chol/HDL Ratio: 4.1 ratio (ref 0.0–5.0)
Cholesterol, Total: 219 mg/dL — ABNORMAL HIGH (ref 100–199)
HDL: 54 mg/dL (ref >39)
LDL Chol Calc (NIH): 147 mg/dL — ABNORMAL HIGH (ref 0–99)
Triglycerides: 102 mg/dL (ref 0–149)
VLDL Cholesterol Cal: 18 mg/dL (ref 5–40)

## 2024-01-22 LAB — TSH: TSH: 2.05 u[IU]/mL (ref 0.450–4.500)

## 2024-01-22 LAB — INSULIN, RANDOM: INSULIN: 67.7 u[IU]/mL — ABNORMAL HIGH (ref 2.6–24.9)

## 2024-01-22 LAB — VITAMIN D 25 HYDROXY (VIT D DEFICIENCY, FRACTURES): Vit D, 25-Hydroxy: 14.9 ng/mL — ABNORMAL LOW (ref 30.0–100.0)

## 2024-01-22 LAB — T4, FREE: Free T4: 1.06 ng/dL (ref 0.82–1.77)

## 2024-01-22 LAB — FOLATE: Folate: 5.1 ng/mL (ref >3.0)

## 2024-01-31 ENCOUNTER — Ambulatory Visit (INDEPENDENT_AMBULATORY_CARE_PROVIDER_SITE_OTHER): Payer: Self-pay | Admitting: Family Medicine

## 2024-02-04 ENCOUNTER — Encounter (INDEPENDENT_AMBULATORY_CARE_PROVIDER_SITE_OTHER): Payer: Self-pay | Admitting: Family Medicine

## 2024-02-04 ENCOUNTER — Ambulatory Visit (INDEPENDENT_AMBULATORY_CARE_PROVIDER_SITE_OTHER): Payer: Self-pay | Admitting: Family Medicine

## 2024-02-04 VITALS — BP 118/75 | HR 71 | Temp 98.1°F | Ht 69.0 in | Wt 384.0 lb

## 2024-02-04 DIAGNOSIS — E538 Deficiency of other specified B group vitamins: Secondary | ICD-10-CM | POA: Diagnosis not present

## 2024-02-04 DIAGNOSIS — F5089 Other specified eating disorder: Secondary | ICD-10-CM

## 2024-02-04 DIAGNOSIS — E66813 Obesity, class 3: Secondary | ICD-10-CM

## 2024-02-04 DIAGNOSIS — Z6841 Body Mass Index (BMI) 40.0 and over, adult: Secondary | ICD-10-CM

## 2024-02-04 DIAGNOSIS — E7849 Other hyperlipidemia: Secondary | ICD-10-CM

## 2024-02-04 DIAGNOSIS — E559 Vitamin D deficiency, unspecified: Secondary | ICD-10-CM | POA: Diagnosis not present

## 2024-02-04 DIAGNOSIS — R7303 Prediabetes: Secondary | ICD-10-CM | POA: Diagnosis not present

## 2024-02-04 MED ORDER — VITAMIN D (ERGOCALCIFEROL) 1.25 MG (50000 UNIT) PO CAPS
50000.0000 [IU] | ORAL_CAPSULE | ORAL | 0 refills | Status: DC
Start: 2024-02-04 — End: 2024-04-30

## 2024-02-04 MED ORDER — B COMPLEX VITAMINS PO CAPS: ORAL_CAPSULE | ORAL | Status: AC

## 2024-02-04 NOTE — Patient Instructions (Signed)
 The 10-year ASCVD risk score (Arnett DK, et al., 2019) is: 4.2%   Values used to calculate the score:     Age: 48 years     Sex: Male     Is Non-Hispanic African American: Yes     Diabetic: No     Tobacco smoker: No     Systolic Blood Pressure: 118 mmHg     Is BP treated: No     HDL Cholesterol: 54 mg/dL     Total Cholesterol: 219 mg/dL

## 2024-02-04 NOTE — Progress Notes (Signed)
 Bill Smith, D.O.  ABFM, ABOM Clinical Bariatric Medicine Physician  Office located at: [address] [place], [place]  [address]      Assessment and Plan:  No orders of the defined types were placed in this encounter.  There are no discontinued medications.   Meds ordered this encounter  Medications   Vitamin D , Ergocalciferol , (DRISDOL ) 1.25 MG (50000 UNIT) CAPS capsule    Sig: Take 1 capsule (50,000 Units total) by mouth every 7 (seven) days.    Dispense:  12 capsule    Refill:  0   b complex vitamins capsule    Sig: Take 500mcg b12 daily, and folate 100-329mcg     FOR THE DISEASE OF OBESITY:  BMI 50.0-59.9, adult (HCC) - Current 56.68 Class 3 severe obesity due to excess calories with serious comorbidity and body mass index (BMI) of 50.0 to 59.9 in adult [facility]) - starting 56.64 Assessment & Plan: Since last office visit on 01/21/2024, patient's muscle mass has decreased by 1.2 lbs. Fat mass has decreased by 3.8 lbs. Total body water has decreased by 1.2 lbs.  Counseling done on how various foods will affect these numbers and how to maximize success  Total lbs lost to date: 5 lbs Total weight loss percentage to date: 1.29%    Recommended Dietary Goals Bill Smith is currently in the action stage of change. As such, his goal is to continue weight management plan.  He has agreed to: continue current plan   Behavioral Intervention We discussed the following today: Measuring/weighing foods, reading food labels  and continue to practice mindfulness when eating  Additional resources provided today: Handout on Pre-Diabetes education  and Handout on insulin  resistance education  Evidence-based interventions for health behavior change were utilized today including the discussion of self monitoring techniques, problem-solving barriers and SMART goal setting techniques.  Regarding patient's less desirable eating habits and patterns, we employed the technique of small  changes.   Pt will specifically work on: Measuring and weighing foods for next OV   Recommended Physical Activity Goals Bill Smith has been advised to work up to 150 minutes of moderate intensity aerobic activity a week and strengthening exercises 2-3 times per week for cardiovascular health, weight loss maintenance and preservation of muscle mass.   He has agreed to: Walk 30 minutes 4-5 days per week   Pharmacotherapy We both agreed to: continue with nutritional and behavioral strategies   FOR ASSOCIATED CONDITIONS ADDRESSED TODAY:  Pre-diabetes - new onset Assessment & Plan: Lab Results  Component Value Date   HGBA1C 6.0 (H) 01/21/2024   HGBA1C 5.4 01/17/2023   HGBA1C 5.0 12/06/2021   INSULIN  67.7 (H) 01/21/2024   Lab Results  Component Value Date   WBC 7.6 01/21/2024   HGB 14.1 01/21/2024   HCT 42.5 01/21/2024   MCV 90 01/21/2024   PLT 391 01/21/2024   Lab Results  Component Value Date   CREATININE 1.12 01/21/2024   BUN 13 01/21/2024   NA 139 01/21/2024   K 4.6 01/21/2024   CL 100 01/21/2024   CO2 23 01/21/2024      Component Value Date/Time   PROT 7.5 01/21/2024 0947   ALBUMIN 4.2 01/21/2024 0947   AST 16 01/21/2024 0947   ALT 17 01/21/2024 0947   ALKPHOS 73 01/21/2024 0947   BILITOT 0.4 01/21/2024 0947   Lab Results  Component Value Date   TSH 2.050 01/21/2024   FREET4 1.06 01/21/2024  This is a new diagnosis for Bill Smith.  Per last obtained labs, his A1c has increased into the Prediabetic range from 5.4 to 6.0. Insulin  levels over 13 times the ideal goal of <5.  CBC, kidney, and liver enzymes are all WNL.   Informed pt that as he sheds fat, the risk of becoming a diabetic decreases.  Eating lean protein and limiting starchy and simple carbs can also decrease A1c levels.  Long discussion about this new disease and how certain foods will affect these labs.  Educated on mechanism of insulin  and it's role on blood sugars.    Advised to go to ADA website for even  more info as desired.  May consider medications in the future, but at this moment will focus on lifestyle changes such as diet, exercise, and weight loss.   Handouts on pre-DM given to pt after education given to pt today as well.   Questions and concerns addressed regarding new condition.    I recommended the patient to drink approximately half of their body weight in ounces of water daily.  Additionally, have an extra bottle of water for every 30 minutes of exercise.  Will monitor condition closely.   Vitamin D  deficiency Assessment & Plan: Lab Results  Component Value Date   VD25OH 14.9 (L) 01/21/2024  Bill Smith Vit D levels were below goal of 50-70, per last labs. Informed pt that low Vit D levels can make it hard to lose weight- leading to leptin resistance, also can make you feel lethargic, and affect the immune system as well as possibly increase his risk of colon cancer and heart disease.  Educated on lipophilic nature of vitamin D  and increasing risk of deficiency with increasing body fat percentage.   Will start on ERGO 50,000 units once weekly. All questions thoroughly answered regarding this condition. Will recheck levels in 3 months to monitor treatment  Relevant Orders: -     Vitamin D  (Ergocalciferol ); Take 1 capsule (50,000 Units total) by mouth every 7 (seven) days.  Dispense: 12 capsule; Refill: 0   B12 deficiency - new onset Assessment & Plan: Lab Results  Component Value Date   VITAMINB12 319 01/21/2024   FOLATE 5.1 01/21/2024  Per labs obtained during LOV, pt's B12 is low at 319. Folate levels are a low normal of 5.1.   CBC stable w/o evidence anemia, however, pt has fatigue/ tiredness, possible symptoms of B12 deficiency.   Encouraged to incorporate B12 rich foods into diet.  D/t low B12 levels and low normal folate levels, will start on B Complex vitamin.  Pt is agreeable to starting supplement. Will continue to monitor levels .  Relevant Orders: -     B Complex Vitamins;  Take approximately 500 mcg b12 daily, and folate 400 mcg daily in form of a b-complex tablet   Other disorder of eating - emotional eating Assessment & Plan: Bill Smith endorses making appointment with Dr. Delaine Favorite and is seeing her soon. He denies doing much emotional eating recently since starting MP. Mood stable, denies any SI/HI. Reminded patient of the importance of following their prudent nutrition plan and how food can affect mood as well to support emotional wellbeing. We will continue to monitor.   Other hyperlipidemia Assessment & Plan: Lab Results  Component Value Date   CHOL 219 (H) 01/21/2024   HDL 54 01/21/2024   LDLCALC 147 (H) 01/21/2024   TRIG 102 01/21/2024   CHOLHDL 4.1 01/21/2024  The 10-year ASCVD risk score (Arnett DK, et al., 2019) is: 4.2%   Values used to  calculate the score:     Age: 48 years     Sex: Male     Is Non-Hispanic African American: Yes     Diabetic: No     Tobacco smoker: No     Systolic Blood Pressure: 118 mmHg     Is BP treated: No     HDL Cholesterol: 54 mg/dL     Total Cholesterol: 219 mg/dL Reviewed most recent labs, LDL is elevated at 147 and should <70 in a diabetic. Ascvd score is 4.2%. Encouraged pt to avoid fatty meats (usually from a cow or pig), saturated, and trans fats. Informed Bill Smith that if ascvd score is >5%, then he may need to cholesterol medication. Recommended pt go to www.heart.org to learn more about cholesterol. Stressed importance of intensive lifestyle changes to prevent needing medications in the future. Continue to follow heart-healthy MP. Will continue to monitor.   FOLLOW UP:   Return in about 1 month (around 03/05/2024). He was informed of the importance of frequent follow up visits to maximize his success with intensive lifestyle modifications for his multiple health conditions.  Subjective:   Chief complaint: Obesity Bill Smith is here to discuss his progress with his obesity treatment plan. He is on the Category 4 plan  with B/L options and 6 ounces at lunch and states he is following his eating plan approximately 50% of the time. He states he is walking 15 minutes 4 days per week.  Interval History:  Bill Smith is here today for his first follow-up office visit since starting the program with us . Wife, Bill, is on the phone listening to lab results. Patient is off to a good start and has lost 5 lbs. He  is measuring his foods. Pt reports his typical lunch includes a salad with 6 ounces of protein, cucumbers, 1 ounce of cheese, and an ounce of dressing. Common proteins Smith is eating are ground [place], salmon and tuna. Additionally, he has not been drinking beer.  All blood work/ lab tests that were recently ordered by myself or an outside provider were reviewed with patient today per their request. Extended time was spent counseling him on all new disease processes that were discovered or preexisting ones that are affected by BMI.  he understands that many of these abnormalities will need to monitored regularly along with the current treatment plan of prudent dietary changes, in which we are making each and every office visit, to improve these health parameters.  Pharmacotherapy for weight loss: He is not currently taking medications  for medical weight loss.    Review of Systems:  Pertinent positives were addressed with patient today.  Reviewed by clinician on day of visit: allergies, medications, problem list, medical history, surgical history, family history, social history, and previous encounter notes.   Weight Summary and Biometrics   Weight Lost Since Last Visit: 5lb  Weight Gained Since Last Visit: 0lb   Vitals Temp: 98.1 F (36.7 C) BP: 118/75 Pulse Rate: 71 SpO2: 100 %   Anthropometric Measurements Height: 5\' 9"  (1.753 m) Weight: (!) 384 lb (174.2 kg) BMI (Calculated): 56.68 Weight at Last Visit: 389lb Weight Lost Since Last Visit: 5lb Weight Gained Since Last Visit: 0lb Starting  Weight: 389lb Total Weight Loss (lbs): 5 lb (2.268 kg) Peak Weight: 398lb Waist Measurement : 61 inches   Body Composition  Body Fat %: 45.1 % Fat Mass (lbs): 173.6 lbs Muscle Mass (lbs): 201 lbs Total Body Water (lbs): 156.8 lbs Visceral Fat Rating : 36  Other Clinical Data Fasting: No Labs: No Today's Visit #: 2 Starting Date: 01/21/24     Objective:   PHYSICAL EXAM:  Blood pressure 118/75, pulse 71, temperature 98.1 F (36.7 C), height 5\' 9"  (1.753 m), weight (!) 384 lb (174.2 kg), SpO2 100%. Body mass index is 56.71 kg/m.  General: he is overweight, cooperative and in no acute distress.   HEENT: EOMI, sclerae are anicteric. Lungs: Normal breathing effort, no conversational dyspnea. M-Sk:  Normal gross ROM * 4 extremities  PSYCH: Has normal mood, affect and thought process. Neurologic: No gross sensory or motor deficits. Well developed, A and O * 3  DIAGNOSTIC DATA REVIEWED:  BMET    Component Value Date/Time   NA 139 01/21/2024 0947   K 4.6 01/21/2024 0947   CL 100 01/21/2024 0947   CO2 23 01/21/2024 0947   GLUCOSE 126 (H) 01/21/2024 0947   BUN 13 01/21/2024 0947   CREATININE 1.12 01/21/2024 0947   CALCIUM  9.6 01/21/2024 0947   GFRNONAA 83 06/03/2018 1709   GFRAA 96 06/03/2018 1709   Lab Results  Component Value Date   HGBA1C 6.0 (H) 01/21/2024   HGBA1C 4.8 06/03/2018   Lab Results  Component Value Date   INSULIN  67.7 (H) 01/21/2024   Lab Results  Component Value Date   TSH 2.050 01/21/2024   CBC    Component Value Date/Time   WBC 7.6 01/21/2024 0947   RBC 4.74 01/21/2024 0947   HGB 14.1 01/21/2024 0947   HCT 42.5 01/21/2024 0947   PLT 391 01/21/2024 0947   MCV 90 01/21/2024 0947   MCH 29.7 01/21/2024 0947   MCHC 33.2 01/21/2024 0947   RDW 11.1 (L) 01/21/2024 0947   Iron Studies No results found for: "IRON", "TIBC", "FERRITIN", "IRONPCTSAT" Lipid Panel     Component Value Date/Time   CHOL 219 (H) 01/21/2024 0947   TRIG 102  01/21/2024 0947   HDL 54 01/21/2024 0947   CHOLHDL 4.1 01/21/2024 0947   LDLCALC 147 (H) 01/21/2024 0947   Hepatic Function Panel     Component Value Date/Time   PROT 7.5 01/21/2024 0947   ALBUMIN 4.2 01/21/2024 0947   AST 16 01/21/2024 0947   ALT 17 01/21/2024 0947   ALKPHOS 73 01/21/2024 0947   BILITOT 0.4 01/21/2024 0947      Component Value Date/Time   TSH 2.050 01/21/2024 0947   Nutritional Lab Results  Component Value Date   VD25OH 14.9 (L) 01/21/2024   VD25OH 7.7 (L) 01/17/2023   VD25OH 6.3 (L) 12/06/2021    Attestations:   I, Bill Smith, acting as a Stage manager for [employer], DO., have compiled all relevant documentation for today's office visit on behalf of Bill Sensor, DO, while in the presence of [employer], DO.  I have reviewed the above documentation for accuracy and completeness, and I agree with the above. Bill Smith, D.O.  The 21st Century Cures Act was signed into law in 2016 which includes the topic of electronic health records.  This provides immediate access to information in MyChart.  This includes consultation notes, operative notes, office notes, lab results and pathology reports.  If you have any questions about what you read please let us  know at your next visit so we can discuss your concerns and take corrective action if need be.  We are right here with you.

## 2024-02-11 ENCOUNTER — Telehealth (INDEPENDENT_AMBULATORY_CARE_PROVIDER_SITE_OTHER): Admitting: Psychology

## 2024-02-11 DIAGNOSIS — F909 Attention-deficit hyperactivity disorder, unspecified type: Secondary | ICD-10-CM | POA: Diagnosis not present

## 2024-02-11 DIAGNOSIS — F5089 Other specified eating disorder: Secondary | ICD-10-CM | POA: Diagnosis not present
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
## 2024-02-26 ENCOUNTER — Telehealth (INDEPENDENT_AMBULATORY_CARE_PROVIDER_SITE_OTHER): Admitting: Psychology

## 2024-02-26 DIAGNOSIS — F909 Attention-deficit hyperactivity disorder, unspecified type: Secondary | ICD-10-CM | POA: Diagnosis not present

## 2024-02-26 DIAGNOSIS — F5089 Other specified eating disorder: Secondary | ICD-10-CM | POA: Diagnosis not present

## 2024-02-26 NOTE — Progress Notes (Signed)
  Office: [phone]  /  Fax: [phone]    Date: Feb 26, 2024  Appointment Start Time: 4:01pm Duration: 25 minutes Provider: Catherene Close, Psy.D. Type of Session: Individual Therapy  Location of Patient: Home (private location) Location of Provider: Provider's Home (private office) Type of Contact: Telepsychological Visit via MyChart Video Visit  Session Content: Bill Smith is a 48 y.o. male presenting for a follow-up appointment to address the previously established treatment goal of increasing coping skills. Today's appointment was a telepsychological visit. [name] provided verbal consent for today's telepsychological appointment and he is aware he is responsible for securing confidentiality on his end of the session. Prior to proceeding with today's appointment, [name]'s physical location at the time of this appointment was obtained as well a phone number he could be reached at in the event of technical difficulties. [name] and this provider participated in today's telepsychological service.   This provider conducted a brief check-in. [name] stated he is hoping to move to the [place] area. Explored recent eating habits. Reviewed emotional and physical hunger. Psychoeducation regarding triggers for emotional eating was provided. [name] was provided a handout, and encouraged to utilize the handout between now and the next appointment to increase awareness of triggers and frequency. [name] agreed. This provider also discussed behavioral strategies for specific triggers, such as placing the utensil down when conversing to avoid mindless eating. [name] provided verbal consent during today's appointment for this provider to send a handout about triggers via e-mail. Overall, [name] was receptive to today's appointment as evidenced by openness to sharing, responsiveness to feedback, and willingness to explore triggers for emotional eating.  Mental Status Examination:  Appearance: neat Behavior: appropriate  to circumstances Mood: neutral Affect: mood congruent Speech: WNL Eye Contact: appropriate Psychomotor Activity: WNL Gait: unable to assess Thought Process: linear, logical, and goal directed and no evidence or endorsement of suicidal, homicidal, and self-harm ideation, plan and intent  Thought Content/Perception: no hallucinations, delusions, bizarre thinking or behavior endorsed or observed Orientation: AAOx4 Memory/Concentration: intact Insight: good Judgment: good  Interventions:  Conducted a brief chart review Provided empathic reflections and validation Reviewed content from the previous session Provided positive reinforcement Employed supportive psychotherapy interventions to facilitate reduced distress and to improve coping skills with identified stressors Psychoeducation provided regarding triggers for emotional eating behaviors  DSM-5 Diagnosis(es): F50.89 Other Specified Feeding or Eating Disorder, Emotional Eating Behaviors and F90.9 Unspecified Attention-Deficit/Hyperactivity Disorder  Treatment Goal & Progress: During the initial appointment with this provider, the following treatment goal was established: increase coping skills. Progress is limited, as [name] has just begun treatment with this provider; however, he is receptive to the interaction and interventions and rapport is being established.   Plan: The next appointment is scheduled for 04/01/2024 at 9:30am, which will be via MyChart Video Visit. The next session will focus on working towards the established treatment goal.   Catherene Close, PsyD

## 2024-03-05 ENCOUNTER — Encounter (INDEPENDENT_AMBULATORY_CARE_PROVIDER_SITE_OTHER): Payer: Self-pay | Admitting: Family Medicine

## 2024-03-05 ENCOUNTER — Ambulatory Visit (INDEPENDENT_AMBULATORY_CARE_PROVIDER_SITE_OTHER): Admitting: Family Medicine

## 2024-03-05 VITALS — BP 108/71 | HR 87 | Temp 98.4°F | Ht 69.0 in | Wt 385.0 lb

## 2024-03-05 DIAGNOSIS — R7303 Prediabetes: Secondary | ICD-10-CM | POA: Diagnosis not present

## 2024-03-05 DIAGNOSIS — Z6841 Body Mass Index (BMI) 40.0 and over, adult: Secondary | ICD-10-CM

## 2024-03-05 DIAGNOSIS — E66813 Obesity, class 3: Secondary | ICD-10-CM | POA: Diagnosis not present

## 2024-03-05 DIAGNOSIS — G4733 Obstructive sleep apnea (adult) (pediatric): Secondary | ICD-10-CM

## 2024-03-05 NOTE — Assessment & Plan Note (Signed)
 Patient wears CPAP nightly.  Some days he feels refreshed upon waking especially if he sleeps 7-8 hours.  He has significantly improved in his compliance.  Improvement in sleep with CPAP.

## 2024-03-05 NOTE — Progress Notes (Signed)
 SUBJECTIVE:  Chief Complaint: Obesity  Interim History: Patient normally sees Dr. Deanna Expose and it is his first visit with me.  He mentions he has eaten out about 2 times since last appointment.  He has been exercising everyday in some form or fashion.  Some days he does a mile and other days gets up to a mile and a half. Not experiencing any hunger and is occasionally forcing himself to eat.  Only feels hungry when he first wakes up in the am.  Feels the Category 4 is a bit boring and is looking for other options.   Bill Smith is here to discuss his progress with his obesity treatment plan. He is on the Category 4 Plan and states he is following his eating plan approximately 90 % of the time. He states he is exercising 30-45 minutes 7 times per week.   OBJECTIVE: Visit Diagnoses: Problem List Items Addressed This Visit       Respiratory   OSA on CPAP   Patient wears CPAP nightly.  Some days he feels refreshed upon waking especially if he sleeps 7-8 hours.  He has significantly improved in his compliance.  Improvement in sleep with CPAP.        Other   Prediabetes - Primary   Working on food choice control and increasing total intake of protein while being control of simple carbohydrates.  Not on pharmacologic treatment at this time.  We discussed possible options for treatment like metformin and will defer meds at this time.  May benefit from treatment if weight decrease is not possible due to insulin  resistance.      Other Visit Diagnoses       Class 3 severe obesity due to excess calories with serious comorbidity and body mass index (BMI) of 50.0 to 59.9 in adult (HCC) - starting 56.64         BMI 50.0-59.9, adult (HCC) - Current 56.68           Vitals Temp: 98.4 F (36.9 C) BP: 108/71 Pulse Rate: 87 SpO2: 98 %   Anthropometric Measurements Height: 5\' 9"  (1.753 m) Weight: (!) 385 lb (174.6 kg) BMI (Calculated): 56.83 Weight at Last Visit: 384 lb Weight Lost Since Last Visit:  0 Weight Gained Since Last Visit: 1 Starting Weight: 389 lb Total Weight Loss (lbs): 4 lb (1.814 kg) Peak Weight: 398 lb   Body Composition  Body Fat %: 45 % Fat Mass (lbs): 173.2 lbs Muscle Mass (lbs): 201.6 lbs Total Body Water (lbs): 157.2 lbs Visceral Fat Rating : 36   Other Clinical Data Fasting: no Labs: no Today's Visit #: 3 Starting Date: 01/21/24 Comments: Cat 4     ASSESSMENT AND PLAN:  Diet: Basim is currently in the action stage of change. As such, his goal is to continue with weight loss efforts and has agreed to the Category 4 Plan with option for logging at dinner to be 550-700 calories and 50 or more grams of protein.  Exercise:  For substantial health benefits, adults should do at least 150 minutes (2 hours and 30 minutes) a week of moderate-intensity, or 75 minutes (1 hour and 15 minutes) a week of vigorous-intensity aerobic physical activity, or an equivalent combination of moderate- and vigorous-intensity aerobic activity. Aerobic activity should be performed in episodes of at least 10 minutes, and preferably, it should be spread throughout the week.  Behavior Modification:  We discussed the following Behavioral Modification Strategies today: increasing lean protein intake, decreasing simple carbohydrates,  increasing vegetables, meal planning and cooking strategies, keeping healthy foods in the home, and planning for success.   Return in about 4 weeks (around 04/02/2024) for with Dr. Deanna Expose.   He was informed of the importance of frequent follow up visits to maximize his success with intensive lifestyle modifications for his multiple health conditions.  Attestation Statements:   Reviewed by clinician on day of visit: allergies, medications, problem list, medical history, surgical history, family history, social history, and previous encounter notes.     Donaciano Frizzle, MD

## 2024-03-05 NOTE — Assessment & Plan Note (Signed)
 Working on food choice control and increasing total intake of protein while being control of simple carbohydrates.  Not on pharmacologic treatment at this time.  We discussed possible options for treatment like metformin and will defer meds at this time.  May benefit from treatment if weight decrease is not possible due to insulin  resistance.

## 2024-04-01 ENCOUNTER — Telehealth (INDEPENDENT_AMBULATORY_CARE_PROVIDER_SITE_OTHER): Payer: Self-pay | Admitting: Psychology

## 2024-04-01 ENCOUNTER — Telehealth (INDEPENDENT_AMBULATORY_CARE_PROVIDER_SITE_OTHER): Admitting: Psychology

## 2024-04-01 NOTE — Progress Notes (Unsigned)
  Office: [phone]  /  Fax: [phone]    Date: April 01, 2024  Appointment Start Time: *** Duration: *** minutes Provider: Catherene Close, Psy.D. Type of Session: Individual Therapy  Location of Patient: {gbptloc:23249} (private location) Location of Provider: Provider's Home (private office) Type of Contact: Telepsychological Visit via MyChart Video Visit  Session Content: Bill Smith is a 48 y.o. male presenting for a follow-up appointment to address the previously established treatment goal of increasing coping skills.Today's appointment was a telepsychological visit. [name] provided verbal consent for today's telepsychological appointment and he is aware he is responsible for securing confidentiality on his end of the session. Prior to proceeding with today's appointment, [name]'s physical location at the time of this appointment was obtained as well a phone number he could be reached at in the event of technical difficulties. [name] and this provider participated in today's telepsychological service.   This provider conducted a brief check-in. *** [name] was receptive to today's appointment as evidenced by openness to sharing, responsiveness to feedback, and {gbreceptiveness:23401}.  Mental Status Examination:  Appearance: {Appearance:22431} Behavior: {Behavior:22445} Mood: {gbmood:21757} Affect: {Affect:22436} Speech: {Speech:22432} Eye Contact: {Eye Contact:22433} Psychomotor Activity: {Motor Activity:22434} Gait: {gbgait:23404} Thought Process: {thought process:22448}  Thought Content/Perception: {disturbances:22451} Orientation: {Orientation:22437} Memory/Concentration: {gbcognition:22449} Insight: {Insight:22446} Judgment: {Insight:22446}  Interventions:  {Interventions for Progress Notes:23405}  DSM-5 Diagnosis(es): {Diagnoses:22752}  Treatment Goal & Progress: During the initial appointment with this provider, the following treatment goal was established: increase coping  skills. [name] has demonstrated progress in his goal as evidenced by {gbtxprogress:22839}. [name] also {gbtxprogress2:22951}.  Plan: The next appointment is scheduled for *** at ***, which will be via MyChart Video Visit. The next session will focus on {Plan for Next Appointment:23400}.   Catherene Close, PsyD

## 2024-04-01 NOTE — Telephone Encounter (Signed)
  Office: [phone]  /  Fax: [phone]  Date of Call: April 01, 2024  Provider: Catherene Close, PsyD  CONTENT: This provider called [name] to check-in as he did not present for today's MyChart Video Visit appointment. A HIPAA compliant voicemail was left requesting a call back.   PLAN: This provider will wait for Sylis to call back. No further follow-up planned by this provider.

## 2024-04-16 ENCOUNTER — Ambulatory Visit (INDEPENDENT_AMBULATORY_CARE_PROVIDER_SITE_OTHER): Admitting: Family Medicine

## 2024-04-30 ENCOUNTER — Encounter (INDEPENDENT_AMBULATORY_CARE_PROVIDER_SITE_OTHER): Payer: Self-pay | Admitting: Family Medicine

## 2024-04-30 ENCOUNTER — Ambulatory Visit (INDEPENDENT_AMBULATORY_CARE_PROVIDER_SITE_OTHER): Admitting: Family Medicine

## 2024-04-30 VITALS — BP 115/78 | HR 71 | Temp 98.6°F | Ht 69.0 in | Wt 372.0 lb

## 2024-04-30 DIAGNOSIS — Z6841 Body Mass Index (BMI) 40.0 and over, adult: Secondary | ICD-10-CM

## 2024-04-30 DIAGNOSIS — E538 Deficiency of other specified B group vitamins: Secondary | ICD-10-CM | POA: Diagnosis not present

## 2024-04-30 DIAGNOSIS — E66813 Obesity, class 3: Secondary | ICD-10-CM

## 2024-04-30 DIAGNOSIS — R7303 Prediabetes: Secondary | ICD-10-CM | POA: Diagnosis not present

## 2024-04-30 DIAGNOSIS — E559 Vitamin D deficiency, unspecified: Secondary | ICD-10-CM | POA: Diagnosis not present

## 2024-04-30 MED ORDER — VITAMIN D (ERGOCALCIFEROL) 1.25 MG (50000 UNIT) PO CAPS: 50000.0000 [IU] | ORAL_CAPSULE | ORAL | 0 refills | Status: DC

## 2024-04-30 NOTE — Progress Notes (Signed)
 Bill Smith, D.O.  ABFM, ABOM Specializing in Clinical Bariatric Medicine  Office located at: [address] [place], [place]  [address]   Assessment and Plan:   Orders Placed This Encounter  Procedures   Hemoglobin A1c   VITAMIN D  25 Hydroxy (Vit-D Deficiency, Fractures)   Vitamin B12   Medications Discontinued During This Encounter  Medication Reason   Vitamin D , Ergocalciferol , (DRISDOL ) 1.25 MG (50000 UNIT) CAPS capsule Reorder    Meds ordered this encounter  Medications   Vitamin D , Ergocalciferol , (DRISDOL ) 1.25 MG (50000 UNIT) CAPS capsule    Sig: Take 1 capsule (50,000 Units total) by mouth every 7 (seven) days.    Dispense:  12 capsule    Refill:  0     Labs obtained today (A1c, vit B12, vit D) will be reviewed at next OV.     FOR THE DISEASE OF OBESITY:  Class 3 severe obesity due to excess calories with serious comorbidity and body mass index (BMI) of 50.0 to 59.9 in adult [facility]) - starting 56.64 BMI 50.0-59.9, adult [facility]) - Current 54.93 Assessment & Plan: Since last office visit on 03/05/24 with Dr. Elio, patient's muscle mass has decreased by 3.6 lbs. Fat mass has decreased by 8.4 lbs. Total body water has decreased by 0.2 lbs.  Counseling done on how various foods will affect these numbers and how to maximize success  Total lbs lost to date: 17 lbs Total weight loss percentage to date: -4.37%   Recommended Dietary Goals [name] is currently in the action stage of change. As such, his goal is to continue weight management plan.  He has agreed to: Category 4 Meal Plan w B/L options and 6 ounces of protein at lunch AND start journaling 2300-2400 calories and 160+ g of protein per day   Behavioral Intervention Pt is in the process of moving to [place], [place]. We discussed the strategies below in the case he will not be able to follow up as regularly as he would like.   We discussed the following today: increasing lean protein intake to established  goals, decreasing simple carbohydrates , avoiding skipping meals, increasing water intake , work on meal planning and preparation, work on tracking and journaling calories using tracking application, keeping healthy foods at home, decreasing eating out or consumption of processed foods, and making healthy choices when eating convenient foods, work on managing stress, creating time for self-care and relaxation, planning for success, and focusing on food with a 10:1 ratio of calories: grams of protein.  Additional resources provided today: Handout on Daily Food Journaling Log  Evidence-based interventions for health behavior change were utilized today including the discussion of self monitoring techniques, problem-solving barriers and SMART goal setting techniques.   Regarding patient's less desirable eating habits and patterns, we employed the technique of small changes.   Pt will specifically work on: Not skipping meals and start journaling daily intake    Recommended Physical Activity Goals Vasco has been advised to work up to 300-450 minutes of moderate intensity aerobic activity a week and strengthening exercises 2-3 times per week for cardiovascular health, weight loss maintenance and preservation of muscle mass.   He has agreed to: Increase physical activity in their day and reduce sedentary time (increase NEAT). and continue to gradually increase the amount and intensity of exercise routine   Pharmacotherapy We both agreed to: Continue with current nutritional and behavioral strategies   ASSOCIATED CONDITIONS ADDRESSED TODAY:  Prediabetes Assessment & Plan: Lab  Results  Component Value Date   HGBA1C 5.1 04/30/2024   HGBA1C 6.0 (H) 01/21/2024   HGBA1C 5.4 01/17/2023   Reviewed last obtained labs: A1c improved from 60 to 5.1. Not currently on any meds. Diet/lifestyle controlled. No acute concerns today.   Continue focusing on low-carb and high protein/fiber diet. Increase protein  to promote weight loss and improve glycemic control. Encouraged pt to increase his exercise with strength training. Rechecking A1c today. Will review results at next OV.    Vitamin D  deficiency Assessment & Plan: Lab Results  Component Value Date   VD25OH 14.9 (L) 01/21/2024   VD25OH 7.7 (L) 01/17/2023   VD25OH 6.3 (L) 12/06/2021   Reviewed last obtained labs: Vit D below goal at 14.9. Compliant with ERGO 50K units once weekly. Tolerating well with no adverse side effects.   Ideal vit D levels of 50-70 reviewed with pt. Continue with his vitamin D  supplementation as prescribed. Refilling ERGO today, no changes made. Rechecking vit D today. Will review results at next OV.    B12 deficiency Assessment & Plan: Lab Results  Component Value Date   VITAMINB12 672 04/30/2024   VITAMINB12 319 01/21/2024   Last obtained B12 is at goal of 500+. Pt on OTC B12 supplementation, 500 mcg daily. Some skipped doses reported. Tolerating well with no adverse side effects. No acute concerns in this regard.   Reviewed ideal goal of >500. Continue supplementation at current dose. Rechecking vit B12 today. Will review results at next OV.    Follow up:   Return in 6 weeks (on 06/11/2024) for 4-6 week f/u .  He was informed of the importance of frequent follow up visits to maximize his success with intensive lifestyle modifications for his multiple health conditions.  Subjective:   Chief complaint: Obesity Bill Smith is here to discuss his progress with his obesity treatment plan. He is on the Category 4 Plan B/L options and 6 ounces at lunch and states he is following his eating plan approximately 50% of the time. He states he is walking/moving from [place] to [facility] 30 minutes 5 days per week.  Interval History:  Bill Smith is here for a follow up office visit. Since last OV on 03/05/24 with Dr. Elio, he is down 13 lbs. He is in the process of moving to [place], [place] and states he was unable to fully  follow his meal plan. He has been more physically active during this moving process. He adds this has increased his stress levels, as he has had to deal with different issues. On occasion he skips meals or had to eat out and off plan. Reports eating [employer] and [employer] at times. When eating at [employer] he tries to chose healthy options including brown rice and double portions of chicken. Does not tend to eat out more during the weekends. Has been trying to limit the frequency at which he and his family is eating out to only once weekly.  Skipped breakfast and lunch today; has only had coffee with creamer today due to running errands.   Pharmacotherapy that aid with weight loss: He is currently taking no anti-obesity medication.   Review of Systems:  Pertinent positives were addressed with patient today.  Reviewed by clinician on day of visit: allergies, medications, problem list, medical history, surgical history, family history, social history, and previous encounter notes.  Weight Summary and Biometrics   Weight Lost Since Last Visit: 13lb  Weight Gained Since Last Visit: 0lb   Vitals Temp: 98.6  F (37 C) BP: 115/78 Pulse Rate: 71 SpO2: 100 %   Anthropometric Measurements Height: 5' 9 (1.753 m) Weight: (!) 372 lb (168.7 kg) BMI (Calculated): 54.91 Weight at Last Visit: 385lb Weight Lost Since Last Visit: 13lb Weight Gained Since Last Visit: 0lb Starting Weight: 389lb Total Weight Loss (lbs): 17 lb (7.711 kg) Peak Weight: 398lb   Body Composition  Body Fat %: 44.2 % Fat Mass (lbs): 164.8 lbs Muscle Mass (lbs): 198 lbs Total Body Water (lbs): 157 lbs Visceral Fat Rating : 34   Other Clinical Data Fasting: No Labs: no Today's Visit #: 4 Starting Date: 01/21/24 Comments: Cat 4    Objective:   PHYSICAL EXAM: Blood pressure 115/78, pulse 71, temperature 98.6 F (37 C), height 5' 9 (1.753 m), weight (!) 372 lb (168.7 kg), SpO2 100%. Body mass index is  54.93 kg/m.  General: he is overweight, cooperative and in no acute distress. PSYCH: Has normal mood, affect and thought process.   HEENT: EOMI, sclerae are anicteric. Lungs: Normal breathing effort, no conversational dyspnea. Extremities: Moves * 4 Neurologic: A and O * 3, good insight  DIAGNOSTIC DATA REVIEWED: BMET    Component Value Date/Time   NA 139 01/21/2024 0947   K 4.6 01/21/2024 0947   CL 100 01/21/2024 0947   CO2 23 01/21/2024 0947   GLUCOSE 126 (H) 01/21/2024 0947   BUN 13 01/21/2024 0947   CREATININE 1.12 01/21/2024 0947   CALCIUM  9.6 01/21/2024 0947   GFRNONAA 83 06/03/2018 1709   GFRAA 96 06/03/2018 1709   Lab Results  Component Value Date   HGBA1C 6.0 (H) 01/21/2024   HGBA1C 4.8 06/03/2018   Lab Results  Component Value Date   INSULIN  67.7 (H) 01/21/2024   Lab Results  Component Value Date   TSH 2.050 01/21/2024   CBC    Component Value Date/Time   WBC 7.6 01/21/2024 0947   RBC 4.74 01/21/2024 0947   HGB 14.1 01/21/2024 0947   HCT 42.5 01/21/2024 0947   PLT 391 01/21/2024 0947   MCV 90 01/21/2024 0947   MCH 29.7 01/21/2024 0947   MCHC 33.2 01/21/2024 0947   RDW 11.1 (L) 01/21/2024 0947   Iron Studies No results found for: IRON, TIBC, FERRITIN, IRONPCTSAT Lipid Panel     Component Value Date/Time   CHOL 219 (H) 01/21/2024 0947   TRIG 102 01/21/2024 0947   HDL 54 01/21/2024 0947   CHOLHDL 4.1 01/21/2024 0947   LDLCALC 147 (H) 01/21/2024 0947   Hepatic Function Panel     Component Value Date/Time   PROT 7.5 01/21/2024 0947   ALBUMIN 4.2 01/21/2024 0947   AST 16 01/21/2024 0947   ALT 17 01/21/2024 0947   ALKPHOS 73 01/21/2024 0947   BILITOT 0.4 01/21/2024 0947      Component Value Date/Time   TSH 2.050 01/21/2024 0947   Nutritional Lab Results  Component Value Date   VD25OH 14.9 (L) 01/21/2024   VD25OH 7.7 (L) 01/17/2023   VD25OH 6.3 (L) 12/06/2021    Attestations:   LILLETTE Vernell Forest, acting as a medical scribe  for Bill Jenkins, DO., have compiled all relevant documentation for today's office visit on behalf of Bill Jenkins, DO, while in the presence of [employer], DO.  I have reviewed the above documentation for accuracy and completeness, and I agree with the above. Bill JINNY Smith, D.O.  The 21st Century Cures Act was signed into law in 2016 which includes the topic of electronic health records.  This provides immediate access to information in MyChart.  This includes consultation notes, operative notes, office notes, lab results and pathology reports.  If you have any questions about what you read please let us  know at your next visit so we can discuss your concerns and take corrective action if need be.  We are right here with you.

## 2024-05-01 LAB — VITAMIN D 25 HYDROXY (VIT D DEFICIENCY, FRACTURES): Vit D, 25-Hydroxy: 39.2 ng/mL (ref 30.0–100.0)

## 2024-05-01 LAB — VITAMIN B12: Vitamin B-12: 672 pg/mL (ref 232–1245)

## 2024-05-01 LAB — HEMOGLOBIN A1C
Est. average glucose Bld gHb Est-mCnc: 100 mg/dL
Hgb A1c MFr Bld: 5.1 % (ref 4.8–5.6)

## 2024-06-04 ENCOUNTER — Ambulatory Visit (INDEPENDENT_AMBULATORY_CARE_PROVIDER_SITE_OTHER): Admitting: Family Medicine

## 2024-06-04 ENCOUNTER — Encounter (INDEPENDENT_AMBULATORY_CARE_PROVIDER_SITE_OTHER): Payer: Self-pay | Admitting: Family Medicine

## 2024-06-04 VITALS — BP 123/70 | HR 68 | Temp 97.9°F | Ht 69.0 in | Wt 373.0 lb

## 2024-06-04 DIAGNOSIS — E66813 Obesity, class 3: Secondary | ICD-10-CM | POA: Diagnosis not present

## 2024-06-04 DIAGNOSIS — E538 Deficiency of other specified B group vitamins: Secondary | ICD-10-CM

## 2024-06-04 DIAGNOSIS — E559 Vitamin D deficiency, unspecified: Secondary | ICD-10-CM

## 2024-06-04 DIAGNOSIS — E65 Localized adiposity: Secondary | ICD-10-CM

## 2024-06-04 DIAGNOSIS — R7303 Prediabetes: Secondary | ICD-10-CM

## 2024-06-04 DIAGNOSIS — Z6841 Body Mass Index (BMI) 40.0 and over, adult: Secondary | ICD-10-CM

## 2024-06-04 MED ORDER — VITAMIN D (ERGOCALCIFEROL) 1.25 MG (50000 UNIT) PO CAPS: ORAL_CAPSULE | ORAL | 0 refills | Status: AC

## 2024-06-04 NOTE — Progress Notes (Signed)
 Bill Smith, D.O.  ABFM, ABOM Specializing in Clinical Bariatric Medicine  Office located at: [address] [place], [place]  [address]   Assessment and Plan:   Medications Discontinued During This Encounter  Medication Reason   Vitamin D , Ergocalciferol , (DRISDOL ) 1.25 MG (50000 UNIT) CAPS capsule Reorder    Meds ordered this encounter  Medications   Vitamin D , Ergocalciferol , (DRISDOL ) 1.25 MG (50000 UNIT) CAPS capsule    Sig: 1 po q mon and 1 po q thurs    Dispense:  8 capsule    Refill:  0      FOR THE DISEASE OF OBESITY:  Class 3 severe obesity due to excess calories with serious comorbidity and body mass index (BMI) of 50.0 to 59.9 in adult (HCC) - starting 56.64 BMI 50.0-59.9, adult (HCC) - Current 55.06 Assessment & Plan: Since last office visit on 04/30/24 patient's muscle mass has decreased by 0.6 lbs. Fat mass has increased by 1 lb. Total body water has decreased by 4 lbs.  Body fat % has decreased by 0.2 %. Counseling done on how various foods will affect these numbers and how to maximize success  Total lbs lost to date: 16 lbs Total weight loss percentage to date: -4.11 %   Recommended Dietary Goals Manley is currently in the action stage of change. As such, his goal is to continue weight management plan.  He has agreed to:  Journaling 2300-2400 calories and 160+ g of protein per day (using category 4 meal plan w B/L options and 4 ounces)   Behavioral Intervention We discussed the following today: {dowtlossstrategies:31654}  Additional resources provided today: None  Evidence-based interventions for health behavior change were utilized today including the discussion of self monitoring techniques, problem-solving barriers and SMART goal setting techniques.   Regarding patient's less desirable eating habits and patterns, we employed the technique of small changes.   Pt will specifically work on: ***    Recommended Physical Activity Goals Simeon  has been advised to work up to 300-450 minutes of moderate intensity aerobic activity a week and strengthening exercises 2-3 times per week for cardiovascular health, weight loss maintenance and preservation of muscle mass.   He has agreed to: {EMEXERCISE:28847::Think about enjoyable ways to increase daily physical activity and overcoming barriers to exercise,Increase physical activity in their day and reduce sedentary time (increase NEAT).}   Pharmacotherapy We both agreed to: {EMagreedrx:29170}   ASSOCIATED CONDITIONS ADDRESSED TODAY:  Prediabetes Assessment & Plan: Lab Results  Component Value Date   HGBA1C 5.1 04/30/2024   HGBA1C 6.0 (H) 01/21/2024   HGBA1C 5.4 01/17/2023   INSULIN  67.7 (H) 01/21/2024   Reviewed labs obtained LOV: A1c improved to 5.1 from 6.0.   - Increase water intake to at least 1 gallon per day    ***    Vitamin D  deficiency Assessment & Plan: Lab Results  Component Value Date   VD25OH 39.2 04/30/2024   VD25OH 14.9 (L) 01/21/2024   VD25OH 7.7 (L) 01/17/2023   Reviewed labs obtained LOV: Vit D has improved from 14.9 to 39.2, but is still below goal.  Taking ERGO 50K units once weekly (Mondays)   - Ideal vit D range of 50-70.  - Low vit D causes stimulation of hunger/cravings pathways, increase fatigue, and reduce immunity *** - Increase ERGO to x2/weekly, Mondays and Thursdays. - Risks/benefits reviewed  - Will recheck vit D in 3-4 months since vit D increased.    ***    Visceral obesity  Assessment & Plan:  Increasing risk for chronic diseases Uncontrolled    - reviewed trend of BF% and VFR.  - Higher the VFR, the higher the risk of an MI and CVA - VFR - Goal is less than 10 -Educated pt on NASH and how losing weight can help prevent NASH and Cirrhosis.    ***    B12 deficiency Assessment & Plan: Lab Results  Component Value Date   VITAMINB12 672 04/30/2024   VITAMINB12 319 01/21/2024   Lab Results  Component  Value Date   FOLATE 5.1 01/21/2024    Reviewed labs obtained at LOV: B12 has improved and is now at goal. Currently on B12 500 mcg daily. No taking folate   Taking B12 but not taking a B complex.   - Continue taking B12 - try for B complex or take both B12 and folate   ***   Follow up:   No follow-ups on file. *** He was informed of the importance of frequent follow up visits to maximize his success with intensive lifestyle modifications for his multiple health conditions.  Subjective:   Chief complaint: Obesity Bless is here to discuss his progress with his obesity treatment plan. He is on keeping a food journal and adhering to recommended goals of 2300-2400 calories and 160+ g of  protein (may use category 4 meal plan w /L options and 6 ounces of protein at lunch as a guide). Pt is using Cat 4 MP states he is following his eating plan approximately 90% of the time. He states he is not exercising.   Interval History:  Tivon Ceja is here for a follow up office visit. Since last OV on 04/30/2024, he is up 1 lb. He has not been journaling, which he was started on LOV, but has tried to eat on plan. He states he has not journaled in the past. He admits to skipping meals regularly and states he is inconsistent with eating all his meals. When he is eating breakfast, he tends to eat yogurt and fruit. He is eating corn beef sandwich w light bread for lunch, stating he is not eating more than 4-5 ounces of corned beef He aims for 6-7 ounces at dinner. He states his clothes have been fitting differently. He is inconsistent with eating throughout the day. Does not eat bread.    Pharmacotherapy that aid with weight loss: He is currently taking no anti-obesity medication.    Review of Systems:  Pertinent positives were addressed with patient today.  Reviewed by clinician on day of visit: allergies, medications, problem list, medical history, surgical history, family history, social history,  and previous encounter notes.  Weight Summary and Biometrics   Weight Lost Since Last Visit: 0  Weight Gained Since Last Visit: 1lb    Vitals Temp: 97.9 F (36.6 C) BP: 123/70 Pulse Rate: 68 SpO2: 93 %   Anthropometric Measurements Height: 5' 9 (1.753 m) Weight: (!) 373 lb (169.2 kg) BMI (Calculated): 55.06 Weight at Last Visit: 372lb Weight Lost Since Last Visit: 0 Weight Gained Since Last Visit: 1lb Starting Weight: 389lb Total Weight Loss (lbs): 16 lb (7.258 kg) Peak Weight: 398lb   Body Composition  Body Fat %: 44.4 % Fat Mass (lbs): 165.8 lbs Muscle Mass (lbs): 197.4 lbs Total Body Water (lbs): 153 lbs Visceral Fat Rating : 34   Other Clinical Data Fasting: yes Labs: no Today's Visit #: 5 Starting Date: 01/21/24    Objective:   PHYSICAL EXAM: Blood pressure 123/70,  pulse 68, temperature 97.9 F (36.6 C), height 5' 9 (1.753 m), weight (!) 373 lb (169.2 kg), SpO2 93%. Body mass index is 55.08 kg/m.  General: he is overweight, cooperative and in no acute distress. PSYCH: Has normal mood, affect and thought process.   HEENT: EOMI, sclerae are anicteric. Lungs: Normal breathing effort, no conversational dyspnea. Extremities: Moves * 4 Neurologic: A and O * 3, good insight  DIAGNOSTIC DATA REVIEWED: BMET    Component Value Date/Time   NA 139 01/21/2024 0947   K 4.6 01/21/2024 0947   CL 100 01/21/2024 0947   CO2 23 01/21/2024 0947   GLUCOSE 126 (H) 01/21/2024 0947   BUN 13 01/21/2024 0947   CREATININE 1.12 01/21/2024 0947   CALCIUM  9.6 01/21/2024 0947   GFRNONAA 83 06/03/2018 1709   GFRAA 96 06/03/2018 1709   Lab Results  Component Value Date   HGBA1C 5.1 04/30/2024   HGBA1C 4.8 06/03/2018   Lab Results  Component Value Date   INSULIN  67.7 (H) 01/21/2024   Lab Results  Component Value Date   TSH 2.050 01/21/2024   CBC    Component Value Date/Time   WBC 7.6 01/21/2024 0947   RBC 4.74 01/21/2024 0947   HGB 14.1 01/21/2024 0947    HCT 42.5 01/21/2024 0947   PLT 391 01/21/2024 0947   MCV 90 01/21/2024 0947   MCH 29.7 01/21/2024 0947   MCHC 33.2 01/21/2024 0947   RDW 11.1 (L) 01/21/2024 0947   Iron Studies No results found for: IRON, TIBC, FERRITIN, IRONPCTSAT Lipid Panel     Component Value Date/Time   CHOL 219 (H) 01/21/2024 0947   TRIG 102 01/21/2024 0947   HDL 54 01/21/2024 0947   CHOLHDL 4.1 01/21/2024 0947   LDLCALC 147 (H) 01/21/2024 0947   Hepatic Function Panel     Component Value Date/Time   PROT 7.5 01/21/2024 0947   ALBUMIN 4.2 01/21/2024 0947   AST 16 01/21/2024 0947   ALT 17 01/21/2024 0947   ALKPHOS 73 01/21/2024 0947   BILITOT 0.4 01/21/2024 0947      Component Value Date/Time   TSH 2.050 01/21/2024 0947   Nutritional Lab Results  Component Value Date   VD25OH 39.2 04/30/2024   VD25OH 14.9 (L) 01/21/2024   VD25OH 7.7 (L) 01/17/2023    Attestations:   LILLETTE Vernell Forest, acting as a medical scribe for Bill Jenkins, DO., have compiled all relevant documentation for today's office visit on behalf of Bill Jenkins, DO, while in the presence of [employer], DO.   I have reviewed the above documentation for accuracy and completeness, and I agree with the above. Bill JINNY Smith, D.O.  The 21st Century Cures Act was signed into law in 2016 which includes the topic of electronic health records.  This provides immediate access to information in MyChart.  This includes consultation notes, operative notes, office notes, lab results and pathology reports.  If you have any questions about what you read please let us  know at your next visit so we can discuss your concerns and take corrective action if need be.  We are right here with you.

## 2024-06-10 ENCOUNTER — Other Ambulatory Visit (INDEPENDENT_AMBULATORY_CARE_PROVIDER_SITE_OTHER): Payer: Self-pay | Admitting: Family Medicine

## 2024-06-10 DIAGNOSIS — E559 Vitamin D deficiency, unspecified: Secondary | ICD-10-CM

## 2024-07-03 ENCOUNTER — Ambulatory Visit (INDEPENDENT_AMBULATORY_CARE_PROVIDER_SITE_OTHER): Admitting: Family Medicine

## 2024-07-21 ENCOUNTER — Telehealth: Payer: Self-pay

## 2024-07-21 NOTE — Telephone Encounter (Signed)
 Received cmn from [facility],faxed,confirmation received

## 2024-07-30 ENCOUNTER — Encounter (INDEPENDENT_AMBULATORY_CARE_PROVIDER_SITE_OTHER): Payer: Self-pay

## 2024-08-22 DIAGNOSIS — J019 Acute sinusitis, unspecified: Secondary | ICD-10-CM | POA: Diagnosis not present

## 2024-08-22 DIAGNOSIS — R058 Other specified cough: Secondary | ICD-10-CM | POA: Diagnosis not present

## 2024-08-22 DIAGNOSIS — B9689 Other specified bacterial agents as the cause of diseases classified elsewhere: Secondary | ICD-10-CM | POA: Diagnosis not present
